# Patient Record
Sex: Female | Born: 1970
Health system: Southern US, Community
[De-identification: ages and names within clinical notes are randomized; demographics above are authoritative.]

## PROBLEM LIST (undated history)

## (undated) DIAGNOSIS — J302 Other seasonal allergic rhinitis: Secondary | ICD-10-CM

## (undated) DIAGNOSIS — D369 Benign neoplasm, unspecified site: Secondary | ICD-10-CM

## (undated) DIAGNOSIS — D649 Anemia, unspecified: Secondary | ICD-10-CM

## (undated) DIAGNOSIS — R202 Paresthesia of skin: Secondary | ICD-10-CM

## (undated) DIAGNOSIS — I10 Essential (primary) hypertension: Secondary | ICD-10-CM

## (undated) DIAGNOSIS — D490 Neoplasm of unspecified behavior of digestive system: Secondary | ICD-10-CM

## (undated) DIAGNOSIS — K8020 Calculus of gallbladder without cholecystitis without obstruction: Secondary | ICD-10-CM

## (undated) HISTORY — DX: Other seasonal allergic rhinitis: J30.2

## (undated) HISTORY — PX: DILATION AND CURETTAGE OF UTERUS: SHX78

## (undated) HISTORY — PX: TUMOR REMOVAL: SHX12

## (undated) HISTORY — DX: Calculus of gallbladder without cholecystitis without obstruction: K80.20

## (undated) HISTORY — DX: Neoplasm of unspecified behavior of digestive system: D49.0

## (undated) HISTORY — DX: Paresthesia of skin: R20.2

## (undated) HISTORY — DX: Essential (primary) hypertension: I10

## (undated) HISTORY — DX: Anemia, unspecified: D64.9

## (undated) HISTORY — DX: Benign neoplasm, unspecified site: D36.9

---

## 2011-03-08 ENCOUNTER — Ambulatory Visit
Admission: RE | Admit: 2011-03-08 | Discharge: 2011-03-08 | Disposition: A | Discharge status: Home / Self Care | Payer: BC Managed Care – PPO | Source: Ambulatory Visit | Attending: Family Medicine | Admitting: Family Medicine

## 2011-03-08 ENCOUNTER — Other Ambulatory Visit: Payer: Self-pay | Admitting: Family Medicine

## 2011-03-08 DIAGNOSIS — R1032 Left lower quadrant pain: Secondary | ICD-10-CM

## 2011-03-08 MED ORDER — IOHEXOL 300 MG/ML  SOLN
125.0000 mL | Freq: Once | INTRAMUSCULAR | Status: AC | PRN
  Administered 2011-03-08: 125 mL via INTRAVENOUS

## 2013-09-06 ENCOUNTER — Other Ambulatory Visit: Payer: Self-pay | Admitting: Family Medicine

## 2013-09-06 DIAGNOSIS — R1011 Right upper quadrant pain: Secondary | ICD-10-CM

## 2013-09-06 DIAGNOSIS — R1030 Lower abdominal pain, unspecified: Secondary | ICD-10-CM

## 2013-09-15 ENCOUNTER — Other Ambulatory Visit: Payer: BC Managed Care – PPO

## 2013-09-17 ENCOUNTER — Ambulatory Visit
Admission: RE | Admit: 2013-09-17 | Discharge: 2013-09-17 | Disposition: A | Discharge status: Home / Self Care | Payer: BC Managed Care – PPO | Source: Ambulatory Visit | Attending: Family Medicine | Admitting: Family Medicine

## 2013-09-17 DIAGNOSIS — R1011 Right upper quadrant pain: Secondary | ICD-10-CM

## 2013-09-17 DIAGNOSIS — R1030 Lower abdominal pain, unspecified: Secondary | ICD-10-CM

## 2013-10-07 ENCOUNTER — Ambulatory Visit (INDEPENDENT_AMBULATORY_CARE_PROVIDER_SITE_OTHER): Payer: BC Managed Care – PPO | Admitting: General Surgery

## 2013-10-25 ENCOUNTER — Encounter (INDEPENDENT_AMBULATORY_CARE_PROVIDER_SITE_OTHER): Payer: Self-pay | Admitting: General Surgery

## 2013-10-25 ENCOUNTER — Ambulatory Visit (INDEPENDENT_AMBULATORY_CARE_PROVIDER_SITE_OTHER): Payer: BC Managed Care – PPO | Admitting: General Surgery

## 2013-10-25 ENCOUNTER — Other Ambulatory Visit (INDEPENDENT_AMBULATORY_CARE_PROVIDER_SITE_OTHER): Payer: Self-pay | Admitting: General Surgery

## 2013-10-25 DIAGNOSIS — R1011 Right upper quadrant pain: Secondary | ICD-10-CM | POA: Insufficient documentation

## 2013-10-25 DIAGNOSIS — K8020 Calculus of gallbladder without cholecystitis without obstruction: Secondary | ICD-10-CM

## 2013-10-25 NOTE — Patient Instructions (Signed)
Please call our office if you would like to schedule the surgery.  Continue lowfat diet.   [facility] ______[facility] LAPAROSCOPIC SURGERY: POST OP INSTRUCTIONS Always review your discharge instruction sheet given to you by the facility where your surgery was performed. IF YOU HAVE DISABILITY OR FAMILY LEAVE FORMS, YOU MUST BRING THEM TO THE OFFICE FOR PROCESSING.   DO NOT GIVE THEM TO YOUR DOCTOR.  1. A prescription for pain medication may be given to you upon discharge.  Take your pain medication as prescribed, if needed.  If narcotic pain medicine is not needed, then you may take acetaminophen (Tylenol) or ibuprofen (Advil) as needed. 2. Take your usually prescribed medications unless otherwise directed. 3. If you need a refill on your pain medication, please contact your pharmacy.  They will contact our office to request authorization. Prescriptions will not be filled after 5pm or on week-ends. 4. You should follow a light diet the first few days after arrival home, such as soup and crackers, etc.  Be sure to include lots of fluids daily. 5. Most patients will experience some swelling and bruising in the area of the incisions.  Ice packs will help.  Swelling and bruising can take several days to resolve.  6. It is common to experience some constipation if taking pain medication after surgery.  Increasing fluid intake and taking a stool softener (such as Colace) will usually help or prevent this problem from occurring.  A mild laxative (Milk of Magnesia or Miralax) should be taken according to package instructions if there are no bowel movements after 48 hours. 7. Unless discharge instructions indicate otherwise, you may remove your bandages 24-48 hours after surgery, and you may shower at that time.  You may have steri-strips (small skin tapes) in place directly over the incision.  These strips should be left on the skin for 7-10 days.  If your surgeon used skin glue on the incision,  you may shower in 24 hours.  The glue will flake off over the next 2-3 weeks.  Any sutures or staples will be removed at the office during your follow-up visit. 8. ACTIVITIES:  You may resume regular (light) daily activities beginning the next day-such as daily self-care, walking, climbing stairs-gradually increasing activities as tolerated.  You may have sexual intercourse when it is comfortable.  Refrain from any heavy lifting or straining until approved by your doctor. a. You may drive when you are no longer taking prescription pain medication, you can comfortably wear a seatbelt, and you can safely maneuver your car and apply brakes. b. RETURN TO WORK:  __________________________________________________________ 9. You should see your doctor in the office for a follow-up appointment approximately 2-3 weeks after your surgery.  Make sure that you call for this appointment within a day or two after you arrive home to insure a convenient appointment time. 10. OTHER INSTRUCTIONS: __________________________________________________________________________________________________________________________ __________________________________________________________________________________________________________________________ WHEN TO CALL YOUR DOCTOR: 1. Fever over 101.0 2. Inability to urinate 3. Continued bleeding from incision. 4. Increased pain, redness, or drainage from the incision. 5. Increasing abdominal pain  The clinic staff is available to answer your questions during regular business hours.  Please don't hesitate to call and ask to speak to one of the nurses for clinical concerns.  If you have a medical emergency, go to the nearest emergency room or call 911.  A surgeon from [facility] is always on call at the hospital. [address] [place], [place], [place]  [address] ?  [id number], [place], [place]   [address] [phone] ? [phone] ? FAX [phone] Web site:  [email or web address]

## 2013-10-25 NOTE — Progress Notes (Signed)
Patient ID: Meghan Haynes, female   DOB: 07/19/1970, 43 y.o.   MRN: [id number]  Chief Complaint  Patient presents with  . Cholelithiasis    HPI Meghan Haynes is a 43 y.o. female.   HPI  She is referred by Dr. Kenton Kingfisher because of intermittent right upper quadrant pain and gallstones. 7 months ago, she had a severe episode of some right upper quadrant pain as a pressure sensation. It lasted a few hours and resolved. No nausea or fevers. She's had some milder episodes since then. She also has some subscapular discomfort. An ultrasound was performed which demonstrated to moderate-sized gallstones. No biliary dilatation. She has been on a low-fat diet and hasn't had any significant episodes since then. No immediate family history of gallbladder disease. No history of jaundice or hepatitis.  She is here with her husband.  Past Medical History  Diagnosis Date  . Hypertension   . Anemia     Past Surgical History  Procedure Laterality Date  . Dilation and curettage of uterus      Family History  Problem Relation Age of Onset  . Cancer Mother     breast  . Heart disease Father   . Cancer Paternal Grandmother     brain     Social History History  Substance Use Topics  . Smoking status: Never Smoker   . Smokeless tobacco: Not on file  . Alcohol Use: No    Not on File  Current Outpatient Prescriptions  Medication Sig Dispense Refill  . acetaminophen (TYLENOL) 325 MG tablet Take 650 mg by mouth every 6 (six) hours as needed.      . nebivolol (BYSTOLIC) 5 MG tablet Take 5 mg by mouth daily.      . Probiotic Product (ALIGN PO) Take by mouth as needed.       No current facility-administered medications for this visit.    Review of Systems Review of Systems  Constitutional:       She has lost weight since being on a diet.  HENT: Negative.   Respiratory: Negative.   Cardiovascular: Negative.   Gastrointestinal: Positive for abdominal pain and constipation.  Genitourinary:  Negative.   Musculoskeletal: Positive for back pain (right subscapular region).  Neurological: Negative.   Hematological: Negative.     There were no vitals taken for this visit.  Physical Exam Physical Exam  Constitutional:  Obese female in no acute distress.  HENT:  Head: Normocephalic.  Eyes: EOM are normal. No scleral icterus.  Cardiovascular: Normal rate and regular rhythm.   Pulmonary/Chest: Effort normal and breath sounds normal.  Abdominal: Soft. She exhibits no distension and no mass. There is no tenderness.  Musculoskeletal: She exhibits no edema and no tenderness.  Neurological: She is alert.  Skin: Skin is warm and dry.  Psychiatric: She has a normal mood and affect. Her behavior is normal.    Data Reviewed Notes from Dr. Kenton Kingfisher. Ultrasound report.  Assessment    Right upper quadrant pain secondary to symptomatic cholelithiasis.     Plan    We discussed laparoscopic cholecystectomy versus dietary management. If she had the operation, she would like to wait until wintertime. I explained to her that if she was having more episodes, I would recommend to be done sooner rather than later. She should stay on a strict low-fat diet.  I have explained the procedure, risks, and aftercare of cholecystectomy.  Risks include but are not limited to bleeding, infection, wound problems, anesthesia, diarrhea, bile leak, injury  to common bile duct/liver/intestine.  She seems to understand.  She will call us back if she would like to schedule the surgery.         Meghan Haynes J 10/25/2013, 11:13 AM

## 2013-11-19 ENCOUNTER — Telehealth (INDEPENDENT_AMBULATORY_CARE_PROVIDER_SITE_OTHER): Payer: Self-pay

## 2013-11-19 NOTE — Telephone Encounter (Signed)
Pt called to ask about stopping suppliments. Pt advised to d/c.

## 2014-01-10 ENCOUNTER — Ambulatory Visit: Payer: Self-pay | Admitting: Otolaryngology

## 2014-05-25 ENCOUNTER — Encounter: Payer: Self-pay | Admitting: *Deleted

## 2014-05-26 ENCOUNTER — Ambulatory Visit (INDEPENDENT_AMBULATORY_CARE_PROVIDER_SITE_OTHER): Payer: BLUE CROSS/BLUE SHIELD | Admitting: Neurology

## 2014-05-26 ENCOUNTER — Encounter: Payer: Self-pay | Admitting: Neurology

## 2014-05-26 VITALS — BP 168/104 | HR 75 | Ht 68.0 in | Wt 216.3 lb

## 2014-05-26 DIAGNOSIS — R252 Cramp and spasm: Secondary | ICD-10-CM

## 2014-05-26 DIAGNOSIS — R292 Abnormal reflex: Secondary | ICD-10-CM

## 2014-05-26 DIAGNOSIS — G959 Disease of spinal cord, unspecified: Secondary | ICD-10-CM

## 2014-05-26 DIAGNOSIS — R258 Other abnormal involuntary movements: Secondary | ICD-10-CM

## 2014-05-26 DIAGNOSIS — R202 Paresthesia of skin: Secondary | ICD-10-CM

## 2014-05-26 DIAGNOSIS — E538 Deficiency of other specified B group vitamins: Secondary | ICD-10-CM

## 2014-05-26 DIAGNOSIS — R29818 Other symptoms and signs involving the nervous system: Secondary | ICD-10-CM

## 2014-05-26 NOTE — Progress Notes (Signed)
[facility] Clinic Note - Initial Visit   Date: 05/26/2014   Shivali Quackenbush MRN: [id number] DOB: 06/09/1970   Dear Dr. Kenton Kingfisher:   Thank you for your kind referral of Yurika Pereda for consultation of right hemisensory deficits. Although her history is well known to you, please allow [place] to reiterate it for the purpose of our medical record. The patient was accompanied to the clinic by husband who also provides collateral information.     History of Present Illness: Alline Pio is a 44 y.o. right-handed Caucasian female with hypertension and right parotid tumor s/p resection (2015) presenting for evaluation of right sided paresthesias.    She went right parotidectomy on 03/08/2014 and has some right residual paresthesias and mild forehead weakness.  She was doing well until about 3-4 weeks later, when she developed numbness and tingling of the right foot.  Over the next few weeks, she started having skin disturbance of the entire right side from her breast down her abdomen, back, lower leg, and thigh.  She initially thought symptoms may be related to gall stones based on the location of discomfort on her abdomen.  She went to her PCP on 1/25 and had routine blood testing which showed low levels of vitamin D and B12, so has been started on supplementation.  She had noticed improvement of paresthesias over the past week, because she feels sensation to her proximal thigh and lower leg is improving.  She continues to have tingling sensation just beneath her right breast and tight sensation of her right foot. She denies any back pain.  Left side is unaffected.  She does endorse having painful leg cramps prior to seeing her PCP.  Out-side paper records, electronic medical record, and images have been reviewed where available and summarized as:  Labs 04/18/2014:  Na 138, K 4.4, glucose 85, Cr 0.42ferritin 11.6, vitamin D 12.0*, vitamin B12 244 (normal-low), TSH 2.23, fT4  0.78, HbA1c 5.6   Past Medical History  Diagnosis Date  . Hypertension   . Anemia   . Seasonal allergies   . Calculus of gallbladder   . Tumor of parotid gland     Past Surgical History  Procedure Laterality Date  . Dilation and curettage of uterus       Medications:  Current Outpatient Prescriptions on File Prior to Visit  Medication Sig Dispense Refill  . acetaminophen (TYLENOL) 325 MG tablet Take 650 mg by mouth every 6 (six) hours as needed.     No current facility-administered medications on file prior to visit.    Allergies: No Known Allergies  Family History: Family History  Problem Relation Age of Onset  . Cancer Mother     breast  . Heart disease Father   . Cancer Paternal Grandmother     brain   . Hypertension Father     Social History: History   Social History  . Marital Status: Married    Spouse Name: N/A  . Number of Children: N/A  . Years of Education: N/A   Occupational History  . Not on file.   Social History Main Topics  . Smoking status: Never Smoker   . Smokeless tobacco: Not on file  . Alcohol Use: No  . Drug Use: No  . Sexual Activity: Not on file   Other Topics Concern  . Not on file   Social History Narrative   Lives with husband and 2 children in a 2 story home.  Works as a Geophysicist/field seismologist.  Education: some college.     Review of Systems:  CONSTITUTIONAL: No fevers, chills, night sweats, or weight loss.   EYES: No visual changes or eye pain ENT: No hearing changes.  No history of nose bleeds.   RESPIRATORY: No cough, wheezing and shortness of breath.   CARDIOVASCULAR: Negative for chest pain, and palpitations.   GI: Negative for abdominal discomfort, blood in stools or black stools.  No recent change in bowel habits.   GU:  No history of incontinence.   MUSCLOSKELETAL: No history of joint pain or swelling.  No myalgias.   SKIN: Negative for lesions, rash, and itching.   HEMATOLOGY/ONCOLOGY: Negative for prolonged  bleeding, bruising easily, and swollen nodes.  No history of cancer.   ENDOCRINE: Negative for cold or heat intolerance, polydipsia or goiter.   PSYCH:  No depression +anxiety symptoms.   NEURO: As Above.   Vital Signs:  BP 168/104 mmHg  Pulse 75  Ht 5\' 8"  (1.727 m)  Wt 216 lb 5 oz (98.119 kg)  BMI 32.90 kg/m2  SpO2 99%  General Medical Exam:   General:  Anxious appearing, comfortable.   Eyes/ENT: see cranial nerve examination.   Neck: No masses appreciated.  Full range of motion without tenderness.  No carotid bruits. Respiratory:  Clear to auscultation, good air entry bilaterally.   Cardiac:  Regular rate and rhythm, no murmur.   Extremities:  No deformities, edema, or skin discoloration.  Skin:  Hives noted over the neck and back.  Neurological Exam: MENTAL STATUS including orientation to time, place, person, recent and remote memory, attention span and concentration, language, and fund of knowledge is normal.  Speech is not dysarthric.  CRANIAL NERVES: II:  No visual field defects.  Unremarkable fundi.   III-IV-VI: Pupils equal round and reactive to light.  Normal conjugate, extra-ocular eye movements in all directions of gaze.  No nystagmus.  No ptosis.   V:  Normal facial sensation, including right V1.  VII:  Normal facial symmetry and movements.  No pathologic facial reflexes.  VIII:  Normal hearing and vestibular function.   IX-X:  Normal palatal movement.   XI:  Normal shoulder shrug and head rotation.   XII:  Normal tongue strength and range of motion, no deviation or fasciculation.  MOTOR:  No atrophy, fasciculations or abnormal movements.  No pronator drift.  Tone is normal.    Right Upper Extremity:    Left Upper Extremity:    Deltoid  5/5   Deltoid  5/5   Biceps  5/5   Biceps  5/5   Triceps  5/5   Triceps  5/5   Wrist extensors  5/5   Wrist extensors  5/5   Wrist flexors  5/5   Wrist flexors  5/5   Finger extensors  5/5   Finger extensors  5/5   Finger  flexors  5/5   Finger flexors  5/5   Dorsal interossei  5/5   Dorsal interossei  5/5   Abductor pollicis  5/5   Abductor pollicis  5/5   Tone (Ashworth scale)  0  Tone (Ashworth scale)  0   Right Lower Extremity:    Left Lower Extremity:    Hip flexors  5/5   Hip flexors  5/5   Hip extensors  5/5   Hip extensors  5/5   Knee flexors  5/5   Knee flexors  5/5   Knee extensors  5/5   Knee extensors  5/5   Dorsiflexors  5/5   Dorsiflexors  5/5   Plantarflexors  5/5   Plantarflexors  5/5   Toe extensors  5/5   Toe extensors  5/5   Toe flexors  5/5   Toe flexors  5/5   Tone (Ashworth scale)  0+  Tone (Ashworth scale)  0+   MSRs:  Right                                                                 Left brachioradialis 3+  brachioradialis 3+  biceps 3+  biceps 3+  triceps 3+  triceps 3+  patellar 3+  patellar 3+  ankle jerk 2+  ankle jerk 2+  Hoffman no  Hoffman no  plantar response up  plantar response down   SENSORY:  Pin prick deferred by patient.Reduced light touch over the T7 dermatome on the right.  Otherwise, normal and symmetric perception of vibration and proprioception.  Romberg's sign absent.   COORDINATION/GAIT: Normal finger-to- nose-finger and heel-to-shin.  Intact rapid alternating movements bilaterally.  Able to rise from a chair without using arms.  Gait narrow based and stable. Tandem and stressed gait intact.    IMPRESSION: Mrs. Westendorf is a 44 year-old female presenting for evaluation of right sided paresthesias and muscle tightness.  Her exam shows generalized hyperreflexia throughout with extensor plantar response on the right along with T7 sensory level.  With these myelopathic findings, I would like to obtain MRI cervical and thoracic spine. Multiple questions regarding possibilites which I answered to the best of my ability, specifically as to what she may have.  Consideration include transverse myelitis (vital, nutritional, inflammatory, etc), demyelinating disease,  spinal stenosis, as well as vitamin deficiency.  Her previous labs disclosed low vitamin B12 and low vitamin D levels for which she is taking oral supplementation.  I do not feel that her symptoms are related to her recent surgery, as I would have expected symptom onset to be closer to surgery date.  Fortunately, symptoms are improving, so I will hold off on any symptomatic medications.  Of note, her blood pressure is elevated and she broke out in hives due to anxiety regarding appointment and reports having white coat syndrome.  PLAN/RECOMMENDATIONS:  1.  MRI cervical and thoracic spine wwo contrast 2.  Additional work-up pending above results   The duration of this appointment visit was 55 minutes of face-to-face time with the patient.  Greater than 50% of this time was spent in counseling, explanation of diagnosis, planning of further management, and coordination of care.   Thank you for allowing me to participate in patient's care.  If I can answer any additional questions, I would be pleased to do so.    Sincerely,    Camryn Quesinberry K. Posey Pronto, DO

## 2014-05-26 NOTE — Patient Instructions (Addendum)
1.  MRI cervical and thoracic spine 2.  Continue taking vitamin B12 and vitamin D supplements  3.  We will call you with the results of the testing and let you know what steps to take next

## 2014-05-26 NOTE — Progress Notes (Signed)
Note faxed.

## 2014-06-15 ENCOUNTER — Ambulatory Visit
Admission: RE | Admit: 2014-06-15 | Discharge: 2014-06-15 | Disposition: A | Discharge status: Home / Self Care | Payer: BLUE CROSS/BLUE SHIELD | Source: Ambulatory Visit | Attending: Neurology | Admitting: Neurology

## 2014-06-15 ENCOUNTER — Telehealth: Payer: Self-pay | Admitting: Neurology

## 2014-06-15 DIAGNOSIS — G959 Disease of spinal cord, unspecified: Secondary | ICD-10-CM

## 2014-06-15 DIAGNOSIS — R252 Cramp and spasm: Secondary | ICD-10-CM

## 2014-06-15 DIAGNOSIS — R202 Paresthesia of skin: Secondary | ICD-10-CM

## 2014-06-15 DIAGNOSIS — R292 Abnormal reflex: Secondary | ICD-10-CM

## 2014-06-15 DIAGNOSIS — E538 Deficiency of other specified B group vitamins: Secondary | ICD-10-CM

## 2014-06-15 MED ORDER — GADOBENATE DIMEGLUMINE 529 MG/ML IV SOLN
20.0000 mL | Freq: Once | INTRAVENOUS | Status: AC | PRN
  Administered 2014-06-15: 20 mL via INTRAVENOUS

## 2014-06-15 NOTE — Telephone Encounter (Signed)
Unable to reach patient notified [facility] imaging they will get a creatinine when she gets there.

## 2014-06-15 NOTE — Telephone Encounter (Signed)
Pt called stating that she completed her imaging done today.

## 2014-06-15 NOTE — Telephone Encounter (Signed)
[facility] called, pt has appt today at 10am. They are looking for labs - BUN and Creatinine. I checked in EPIC and did not see any results off hand. Please check into this, has pt had labs drawn? CB# [phone]. They are trying to avoid another blood draw if she has had these recently, otherwise they can do it when she comes in today. Please call ASAP as appt is scheduled for 10am today / Sherri

## 2014-06-15 NOTE — Telephone Encounter (Signed)
Is encounter ready to be closed?

## 2014-06-16 ENCOUNTER — Telehealth: Payer: Self-pay | Admitting: Neurology

## 2014-06-16 NOTE — Telephone Encounter (Signed)
Called patient to discuss MRI results, but there was no answer.  Message left to return my call.  Meghan Haynes, if Meghan Haynes calls back, please let her know that there is a very small area on her spinal cord that looks abnormal which is causing her abnormal sensation on the right - it may be due to low vitamin B12 levels.  If she is doing better, we can repeat her MRI in a few months.  If no improvement, I would like to order MRI of the brain to be sure we are not missing an autoimmune/inflammatory process.    Meghan Haynes K. Posey Pronto, DO

## 2014-06-16 NOTE — Telephone Encounter (Signed)
Appointment scheduled for June 3 at 8:00.  Appointment card mailed to patient.

## 2014-06-16 NOTE — Telephone Encounter (Signed)
MRI results discussed with patient. She reports doing slightly better with only right rib paresthesias. We discussed potential possibilities including changes due to B 12 deficiency, demyelinating disease or transverse myelitis. She denies any new or worsening of symptoms. I offered to perform MRI brain to look for other abnormalities of the nervous system, however because she is doing relatively well, we will follow her clinically. If she develops any new symptoms, low threshold to image.  I will see her back in 2-3 months.  Murline Weigel K. Posey Pronto, DO

## 2014-08-18 ENCOUNTER — Ambulatory Visit (INDEPENDENT_AMBULATORY_CARE_PROVIDER_SITE_OTHER): Payer: BLUE CROSS/BLUE SHIELD | Admitting: Neurology

## 2014-08-18 ENCOUNTER — Encounter: Payer: Self-pay | Admitting: Neurology

## 2014-08-18 VITALS — BP 177/95 | HR 74 | Ht 68.0 in | Wt 226.6 lb

## 2014-08-18 DIAGNOSIS — R202 Paresthesia of skin: Secondary | ICD-10-CM | POA: Diagnosis not present

## 2014-08-18 HISTORY — DX: Paresthesia of skin: R20.2

## 2014-08-18 NOTE — Patient Instructions (Signed)

## 2014-08-18 NOTE — Progress Notes (Signed)
Reason for visit: Paresthesias  Referring physician: Dr. Danella Meghan Haynes is a 44 y.o. 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 Within 3 weeks following the surgery, the patient began developing paresthesias involving the right foot that gradually spread up the right leg and into the lower rib cage on the right. The patient had a squeezing sensation in the right lower rib cage. The patient had a sensation of stiffness of the right leg, but no definite change in her ability to ambulate. The patient denied any staggering or falling. She denied any issues controlling the bowels or the bladder. She did not have any sensory alteration on the right arm or right face. She denies any visual disturbances, or speech changes. The patient has had some problems with fatigue. She denies any true weakness of the extremities. She indicates that in the summer of 2015, she was having very transient, brief episodes of right arm weakness and tingling in the feet. The patient had some transient visual alterations in her early 53s when she would go from a light to a dark environment. She does not recall which eye was affected. The patient has had blood work that was done, showing a vitamin B12 level of 244 which is in the low normal range for that lab. A vitamin D level also was done that was in the low normal range. The patient has gone on oral supplementation of the vitamin B12, and she has improved with her symptoms over several months. The patient still has some residual paresthesias of the right foot only. The patient underwent MRI study evaluation of the cervical spine and thoracic spine. The studies were reviewed online. The studies question a spinal cord lesion at the thoracic level 3 and 4. No cord lesions were seen in the cervical area. The patient is sent to this office for further evaluation.  The patient was seen previously by Dr. Posey Haynes from [facility].  Past Medical History  Diagnosis Date  . Hypertension   . Anemia   . Seasonal allergies   . Calculus of gallbladder   . Tumor of parotid gland   . Adenoma   . Paresthesia 08/18/2014    Past Surgical History  Procedure Laterality Date  . Dilation and curettage of uterus    . Tumor removal      right parotid    Family History  Problem Relation Age of Onset  . Cancer Mother     breast  . Heart disease Father   . Cancer Paternal Grandmother     brain   . Hypertension Father   . Heart disease Paternal Grandfather   . Healthy Son   . Healthy Daughter     Social history:  reports that she has never smoked. She does not have any smokeless tobacco history on file. She reports that she does not drink alcohol or use illicit drugs.  Medications:  Prior to Admission medications   Medication Sig Start Date End Date Taking? Authorizing Provider  acetaminophen (TYLENOL) 325 MG tablet Take 650 mg by mouth every 6 (six) hours as needed.   Yes Historical Provider, MD  Cholecalciferol (VITAMIN D3) 1000 UNITS CAPS Take by mouth.   Yes Historical Provider, MD  Cyanocobalamin 1000 MCG TBCR Take by mouth.   Yes Historical Provider, MD  docusate sodium (COLACE) 50 MG capsule Take by mouth daily as  needed.    Yes Historical Provider, MD  Polysaccharide Iron Complex (FERREX 150 PO) Take 1 tablet by mouth daily.   Yes Historical Provider, MD     No Known Allergies  ROS:  Out of a complete 14 system review of symptoms, the patient complains only of the following symptoms, and all other reviewed systems are negative.  Weight loss Constipation Iron deficiency anemia Feeling cold Allergies Numbness, paresthesias Insomnia  Blood pressure 177/95, pulse 74, height 5\' 8"  (1.727 m), weight 226 lb 9.6 oz (102.785 kg).  Physical Exam  General: The patient is alert and cooperative at the time of the examination. The patient is  moderately obese.  Eyes: Pupils are equal, round, and reactive to light. Discs are flat bilaterally.  Neck: The neck is supple, no carotid bruits are noted.  Respiratory: The respiratory examination is clear.  Cardiovascular: The cardiovascular examination reveals a regular rate and rhythm, no obvious murmurs or rubs are noted.  Skin: Extremities are without significant edema.  Neurologic Exam  Mental status: The patient is alert and oriented x 3 at the time of the examination. The patient has apparent normal recent and remote memory, with an apparently normal attention span and concentration ability.  Cranial nerves: Facial symmetry is present. There is good sensation of the face to pinprick and soft touch bilaterally. The strength of the facial muscles and the muscles to head turning and shoulder shrug are normal bilaterally. Speech is well enunciated, no aphasia or dysarthria is noted. Extraocular movements are full. Visual fields are full. The tongue is midline, and the patient has symmetric elevation of the soft palate. No obvious hearing deficits are noted.  Motor: The motor testing reveals 5 over 5 strength of all 4 extremities. Good symmetric motor tone is noted throughout.  Sensory: Sensory testing is intact to pinprick, soft touch, vibration sensation, and position sense on all 4 extremities. No evidence of extinction is noted.  Coordination: Cerebellar testing reveals good finger-nose-finger and heel-to-shin bilaterally.  Gait and station: Gait is normal. Tandem gait is normal. Romberg is negative. No drift is seen.  Reflexes: Deep tendon reflexes are symmetric and normal bilaterally. Toes are downgoing bilaterally.   MRI cervical and thoracic 06/15/14:  IMPRESSION: Small central disc protrusions at C4-5 and C5-6.  Cervical spinal cord appears normal however image quality degraded by mild motion in the cervical spine.  Abnormal cord signal posteriorly on the right at  T3 and T4. The cord is not enlarged and does not show abnormal enhancement. Given the history of the low B12 levels, subacute combined degeneration is in the differential. Unilateral lesion seems unusual for subacute combined degeneration. Transverse myelitis and multiple sclerosis also possible etiologies.  * MRI scan images were reviewed online. I agree with the written report.    Assessment/Plan:  1. Transient paresthesias, right leg and side   The patient has ill-defined abnormalities in the upper thoracic spinal cord on MRI evaluation. The patient may have had a transient episode of sensory alteration in the summer of 2015. The patient never was documented to have a vitamin B-12 deficiency, the level was in the low normal range, and a methylmalonic acid level was never checked to determine if the patient was functioning deficient. The symptoms were unilateral, not bilateral as one would expect with a true B12 deficiency. I do not believe that her current symptoms are related to a vitamin B12 issue. The patient needs a full evaluation to exclude demyelinating disease as an etiology for  her symptoms. The patient will be set up for MRI evaluation of the brain, and further blood work will be done today. The patient will have a visual evoked response test. Lumbar puncture may be considered in the future. The patient will follow-up otherwise in 3 months.  Jill Alexanders MD 08/18/2014 8:00 PM  [facility] [address] [place], [place] [address]  Phone [phone] Fax [phone]

## 2014-08-23 ENCOUNTER — Telehealth: Payer: Self-pay | Admitting: Neurology

## 2014-08-23 ENCOUNTER — Telehealth: Payer: Self-pay

## 2014-08-23 LAB — LUPUS ANTICOAGULANT
Dilute Viper Venom Time: 39.9 s (ref 0.0–55.1)
PTT LA: 41.3 s (ref 0.0–50.0)
Thrombin Time: 15.4 s (ref 0.0–20.0)
dPT Confirm Ratio: 1.06 Ratio (ref 0.00–1.40)
dPT: 40.1 s (ref 0.0–55.0)

## 2014-08-23 LAB — ANGIOTENSIN CONVERTING ENZYME: Angio Convert Enzyme: 50 U/L (ref 14–82)

## 2014-08-23 LAB — NEUROMYELITIS OPTICA AUTOAB, IGG

## 2014-08-23 LAB — HIV ANTIBODY (ROUTINE TESTING W REFLEX): HIV Screen 4th Generation wRfx: NONREACTIVE

## 2014-08-23 LAB — SEDIMENTATION RATE: Sed Rate: 28 mm/hr (ref 0–32)

## 2014-08-23 LAB — RPR: RPR: NONREACTIVE

## 2014-08-23 LAB — B. BURGDORFI ANTIBODIES: Lyme IgG/IgM Ab: <0.91 {ISR} (ref 0.00–0.90)

## 2014-08-23 LAB — ANA W/REFLEX: Anti Nuclear Antibody(ANA): NEGATIVE

## 2014-08-23 LAB — FACTOR 5 LEIDEN

## 2014-08-23 LAB — RHEUMATOID FACTOR: Rheumatoid fact SerPl-aCnc: 9.2 IU/mL (ref 0.0–13.9)

## 2014-08-23 NOTE — Telephone Encounter (Signed)
The blood work results are normal, patient has been asked to call office back

## 2014-08-23 NOTE — Telephone Encounter (Signed)
Patient called regarding MRI scheduled for 08/24/14 at [facility]. She stated the last MRI she had the kidney function on labs were borderline low and was inquiring what the results were that was taken on Thursday.  She also wants to know if [facility] [place] is the most open MRI available. She said she did not do well with the last MRI. Please call and advise. Patient can be reached [phone].

## 2014-08-23 NOTE — Telephone Encounter (Signed)
I called the patient. The patient had blood work done in mid March, estimated GFR was 13, she should be good to have the MRI study done with and without contrast. I discussed the blood work results with her, everything was normal.

## 2014-08-24 ENCOUNTER — Telehealth: Payer: Self-pay

## 2014-08-24 DIAGNOSIS — R202 Paresthesia of skin: Secondary | ICD-10-CM | POA: Diagnosis not present

## 2014-08-24 NOTE — Telephone Encounter (Signed)
Molly from [place] called to find out exactly what pills we gave to the patient for her MRI. I explained to her that we give 3 Xanax 0.5 mg. She asked if the patient would be okay to drive by 1:38. I explained to her that we always tell patients to have a driver when they take the Xanax and that I did not feel comfortable saying she would be okay to drive by 8:71. She stated that she would let the patient know that we could not say she was okay to drive by that time.

## 2014-08-26 ENCOUNTER — Other Ambulatory Visit: Payer: Self-pay | Admitting: Diagnostic Neuroimaging

## 2014-08-26 ENCOUNTER — Telehealth: Payer: Self-pay | Admitting: Neurology

## 2014-08-26 ENCOUNTER — Ambulatory Visit: Payer: BLUE CROSS/BLUE SHIELD | Admitting: Neurology

## 2014-08-26 ENCOUNTER — Ambulatory Visit (INDEPENDENT_AMBULATORY_CARE_PROVIDER_SITE_OTHER): Payer: BLUE CROSS/BLUE SHIELD | Admitting: Neurology

## 2014-08-26 DIAGNOSIS — R202 Paresthesia of skin: Secondary | ICD-10-CM | POA: Diagnosis not present

## 2014-08-26 NOTE — Telephone Encounter (Signed)
I called the patient. The visual response test was unremarkable. MRI the brain as been done, I do not have the results of that study yet.

## 2014-08-26 NOTE — Telephone Encounter (Signed)
I called patient. MRI scan of the brain was unremarkable making the diagnosis of MS less likely. I would consider lumbar puncture for spinal fluid analysis. If she is amenable to this, she is to contact our office.   MRI brain 08/26/14:  IMPRESSION:  Normal MRI brain (with and without).

## 2014-08-26 NOTE — Procedures (Signed)
    History:   Meghan Haynes is a 44 year old patient with a history of right-sided sensory alterations, evidence of a upper thoracic level cord lesion. The patient being evaluated for possible demyelinating disease.   Description: The visual evoked response test was performed today using 32 x 32 check sizes. The absolute latencies for the N1 and the P100 wave forms were within normal limits bilaterally. The amplitudes for the P100 wave forms were also within normal limits bilaterally. The visual acuity was 20/20 OD and 20/30 OS corrected.  Impression:  The visual evoked response test above was within normal limits bilaterally. No evidence of conduction slowing was seen within the anterior visual pathways on either side on today's evaluation.

## 2014-09-01 NOTE — Telephone Encounter (Signed)
I called the patient. She wants to know where the spot found on her spine was exactly and what should be done to follow up with this. I explained that Dr. Jannifer Franklin' note stated she had a normal MRI. She asked to speak to Dr. Jannifer Franklin to determine if the LP is actually necessary.

## 2014-09-01 NOTE — Telephone Encounter (Signed)
Patient called inquiring about MRI results. She has some additional questions. Please call and advise. Patient can be reached at [phone].

## 2014-09-01 NOTE — Telephone Encounter (Signed)
I called the patient. The brain MRI was unremarkable, the patient has a thoracic level 3/4 lesion on the spinal cord. This may be a transverse myelitis, not multiple sclerosis. I have recommended considering a lumbar puncture, the patient does not wish to do this at this time. I will wait about 9 months, repeat MRI evaluation of the brain and spinal cord.

## 2014-09-05 ENCOUNTER — Other Ambulatory Visit: Payer: Self-pay | Admitting: Family Medicine

## 2014-09-05 DIAGNOSIS — N281 Cyst of kidney, acquired: Secondary | ICD-10-CM

## 2014-09-22 ENCOUNTER — Ambulatory Visit
Admission: RE | Admit: 2014-09-22 | Discharge: 2014-09-22 | Disposition: A | Discharge status: Home / Self Care | Payer: BLUE CROSS/BLUE SHIELD | Source: Ambulatory Visit | Attending: Family Medicine | Admitting: Family Medicine

## 2014-09-22 DIAGNOSIS — N281 Cyst of kidney, acquired: Secondary | ICD-10-CM

## 2015-05-31 ENCOUNTER — Telehealth: Payer: Self-pay | Admitting: Neurology

## 2015-05-31 NOTE — Telephone Encounter (Signed)
I called the patient. The patient indicates that she is without any symptoms at this point, she has completely resolved percent her symptoms over 6 months. The patient may have had a transverse myelitis. I have recommended rechecking MRI of the brain and spinal cord to exclude demyelinating disease. The patient is not sure that she want to pursue this at this time, she will get back to me regarding this. I would at least consider MRI of the brain as lesions may be asymptomatic in this area.

## 2015-08-31 ENCOUNTER — Other Ambulatory Visit: Payer: Self-pay | Admitting: Family Medicine

## 2015-08-31 DIAGNOSIS — N281 Cyst of kidney, acquired: Secondary | ICD-10-CM

## 2015-08-31 DIAGNOSIS — E7800 Pure hypercholesterolemia, unspecified: Secondary | ICD-10-CM | POA: Diagnosis not present

## 2015-08-31 DIAGNOSIS — D649 Anemia, unspecified: Secondary | ICD-10-CM | POA: Diagnosis not present

## 2015-08-31 DIAGNOSIS — E538 Deficiency of other specified B group vitamins: Secondary | ICD-10-CM | POA: Diagnosis not present

## 2015-08-31 DIAGNOSIS — E559 Vitamin D deficiency, unspecified: Secondary | ICD-10-CM | POA: Diagnosis not present

## 2015-08-31 DIAGNOSIS — I10 Essential (primary) hypertension: Secondary | ICD-10-CM | POA: Diagnosis not present

## 2015-09-11 ENCOUNTER — Other Ambulatory Visit: Payer: BLUE CROSS/BLUE SHIELD

## 2015-09-29 ENCOUNTER — Ambulatory Visit
Admission: RE | Admit: 2015-09-29 | Discharge: 2015-09-29 | Disposition: A | Discharge status: Home / Self Care | Payer: BLUE CROSS/BLUE SHIELD | Source: Ambulatory Visit | Attending: Family Medicine | Admitting: Family Medicine

## 2015-09-29 DIAGNOSIS — N281 Cyst of kidney, acquired: Secondary | ICD-10-CM

## 2015-10-06 ENCOUNTER — Other Ambulatory Visit: Payer: Self-pay | Admitting: Family Medicine

## 2015-10-06 DIAGNOSIS — N281 Cyst of kidney, acquired: Secondary | ICD-10-CM

## 2015-10-17 DIAGNOSIS — K8020 Calculus of gallbladder without cholecystitis without obstruction: Secondary | ICD-10-CM | POA: Diagnosis not present

## 2015-10-17 DIAGNOSIS — N281 Cyst of kidney, acquired: Secondary | ICD-10-CM | POA: Diagnosis not present

## 2015-10-24 DIAGNOSIS — R1030 Lower abdominal pain, unspecified: Secondary | ICD-10-CM | POA: Diagnosis not present

## 2015-10-24 DIAGNOSIS — K8020 Calculus of gallbladder without cholecystitis without obstruction: Secondary | ICD-10-CM | POA: Diagnosis not present

## 2015-11-22 DIAGNOSIS — Z01411 Encounter for gynecological examination (general) (routine) with abnormal findings: Secondary | ICD-10-CM | POA: Diagnosis not present

## 2015-11-22 DIAGNOSIS — N920 Excessive and frequent menstruation with regular cycle: Secondary | ICD-10-CM | POA: Diagnosis not present

## 2015-11-22 DIAGNOSIS — Z6835 Body mass index (BMI) 35.0-35.9, adult: Secondary | ICD-10-CM | POA: Diagnosis not present

## 2015-11-22 DIAGNOSIS — Z1231 Encounter for screening mammogram for malignant neoplasm of breast: Secondary | ICD-10-CM | POA: Diagnosis not present

## 2015-11-22 DIAGNOSIS — Z01419 Encounter for gynecological examination (general) (routine) without abnormal findings: Secondary | ICD-10-CM | POA: Diagnosis not present

## 2015-11-28 ENCOUNTER — Ambulatory Visit: Payer: Self-pay | Admitting: General Surgery

## 2015-11-28 DIAGNOSIS — K8020 Calculus of gallbladder without cholecystitis without obstruction: Secondary | ICD-10-CM | POA: Diagnosis not present

## 2015-11-28 NOTE — H&P (Signed)
Meghan Haynes 11/28/2015 9:22 AM Location: [facility] Patient #: [id number] DOB: Dec 06, 1970 Married / Language: English / Race: White Female  History of Present Illness Meghan Hollingshead MD; 11/28/2015 9:56 AM) The patient is a 45 year old female.   Note:She presents today to discuss rescheduling cholecystectomy. She was seen approximately 2 years ago with symptomatic cholelithiasis. She ended up having a right parotid gland tumor that was removed and she decided just to try dietary modification rather than have a cholecystectomy. Recently, her episodes of right upper quadrant discomfort radiating to her right subscapular area started to increase. She also has intermittent constipation because she is taking iron. Dr. Kenton Kingfisher has sent her back over here for [place] to rediscuss cholecystectomy. Her husband is with her. She's been trying to low-fat foods. The 2 most recent episodes woke her up at night. Last ultrasound was July 7 of this year to follow up on some kidney cysts. Incidental multiple gallstones were noted.  Other Problems (April Staton, [place]; 11/28/2015 9:22 AM) Cholelithiasis Hemorrhoids High blood pressure  Past Surgical History (April Staton, [place]; 11/28/2015 9:22 AM) Oral Surgery  Diagnostic Studies History (April Staton, [place]; 11/28/2015 9:22 AM) Colonoscopy never Mammogram within last year Pap Smear 1-5 years ago  Allergies (April Staton, CMA; 11/28/2015 9:23 AM) No Known Drug Allergies 11/28/2015  Medication History (April Staton, CMA; 11/28/2015 9:24 AM) Losartan Potassium (50MG  Tablet, Oral) Active. Integra (62.5-62.5-40-3MG  Capsule, Oral) Active. ALPRAZolam (0.25MG  Tablet, Oral) Active. Stool Softener (Oral as needed) Specific dose unknown - Active. Vitamin B12 (100MCG Tablet, Oral) Active. Vitamin D (Cholecalciferol) (1000UNIT Capsule, Oral) Active. Medications Reconciled  Social History (April Staton, CMA; 11/28/2015 9:22 AM) Caffeine  use Tea. No alcohol use No drug use Tobacco use Never smoker.  Family History (April Staton, [place]; 11/28/2015 9:22 AM) Breast Cancer Mother. Hypertension Father.  Pregnancy / Birth History (April Staton, [place]; 11/28/2015 9:22 AM) Age at menarche 89 years. Gravida 2 Length (months) of breastfeeding 3-6 Maternal age 3-25 Para 2     Review of Systems (April Staton CMA; 11/28/2015 9:22 AM) General Not Present- Appetite Loss, Chills, Fatigue, Fever, Night Sweats, Weight Gain and Weight Loss. Skin Not Present- Change in Wart/Mole, Dryness, Hives, Jaundice, New Lesions, Non-Healing Wounds, Rash and Ulcer. HEENT Present- Seasonal Allergies and Wears glasses/contact lenses. Not Present- Earache, Hearing Loss, Hoarseness, Nose Bleed, Oral Ulcers, Ringing in the Ears, Sinus Pain, Sore Throat, Visual Disturbances and Yellow Eyes. Respiratory Not Present- Bloody sputum, Chronic Cough, Difficulty Breathing, Snoring and Wheezing. Breast Not Present- Breast Mass, Breast Pain, Nipple Discharge and Skin Changes. Cardiovascular Not Present- Chest Pain, Difficulty Breathing Lying Down, Leg Cramps, Palpitations, Rapid Heart Rate, Shortness of Breath and Swelling of Extremities. Gastrointestinal Present- Abdominal Pain, Bloating, Constipation, Excessive gas and Hemorrhoids. Not Present- Bloody Stool, Change in Bowel Habits, Chronic diarrhea, Difficulty Swallowing, Gets full quickly at meals, Indigestion, Nausea, Rectal Pain and Vomiting. Female Genitourinary Not Present- Frequency, Nocturia, Painful Urination, Pelvic Pain and Urgency. Musculoskeletal Not Present- Back Pain, Joint Pain, Joint Stiffness, Muscle Pain, Muscle Weakness and Swelling of Extremities. Neurological Not Present- Decreased Memory, Fainting, Headaches, Numbness, Seizures, Tingling, Tremor, Trouble walking and Weakness. Psychiatric Not Present- Anxiety, Bipolar, Change in Sleep Pattern, Depression, Fearful and Frequent  crying. Hematology Not Present- Blood Thinners, Easy Bruising, Excessive bleeding, Gland problems, HIV and Persistent Infections.  Vitals (April Staton CMA; 11/28/2015 9:24 AM) 11/28/2015 9:24 AM Weight: 228.38 lb Height: 67in Height was reported by patient. Body Surface Area: 2.14 m Body Mass Index:  35.77 kg/m  Temp.: 98.67F(Oral)  Pulse: 74 (Regular)  P.OX: 98% (Room air) BP: 132/84 (Sitting, Left Arm, Standard)      Physical Exam Meghan Hollingshead MD; 11/28/2015 9:57 AM)  The physical exam findings are as follows: Note:General: Obese female in NAD. Pleasant and cooperative.  HEENT: [place]/AT, no external nasal or ear masses, mucous membranes are moist  EYES: no scleral icterus  CV: RRR, no murmur, no edema  CHEST: Breath sounds equal and clear. Respirations nonlabored.  ABDOMEN: Soft, nontender, nondistended, no masses, no organomegaly.  MUSCULOSKELETAL: no edema, no venous stasis changes  SKIN: No jaundice.  NEUROLOGIC: Alert and oriented, answers questions appropriately, normal gait and station.  PSYCHIATRIC: She is slightly anxious.    Assessment & Plan Meghan Hollingshead MD; 11/28/2015 10:00 AM)  SYMPTOMATIC CHOLELITHIASIS (K80.20) Impression: Frequency have her episodes have been increasing. Also has some constipation that is exacerbated by her taking her iron for anemia. Current workup for this is ongoing. Right upper quadrant symptoms are fairly typical of biliary colic.  Plan: I recommended laparoscopic cholecystectomy. I have explained the procedure, risks, success rate and aftercare of cholecystectomy. Risks include but are not limited to bleeding, infection, wound problems, anesthesia, diarrhea, bile leak, injury to common bile duct/liver/intestine. She seems to understand and agrees to proceed.  Jackolyn Confer, MD

## 2016-02-28 DIAGNOSIS — K8020 Calculus of gallbladder without cholecystitis without obstruction: Secondary | ICD-10-CM | POA: Diagnosis not present

## 2016-03-01 DIAGNOSIS — K8010 Calculus of gallbladder with chronic cholecystitis without obstruction: Secondary | ICD-10-CM | POA: Diagnosis not present

## 2016-03-01 DIAGNOSIS — K8020 Calculus of gallbladder without cholecystitis without obstruction: Secondary | ICD-10-CM | POA: Diagnosis not present

## 2016-03-12 DIAGNOSIS — J358 Other chronic diseases of tonsils and adenoids: Secondary | ICD-10-CM | POA: Diagnosis not present

## 2016-03-20 DIAGNOSIS — I10 Essential (primary) hypertension: Secondary | ICD-10-CM | POA: Diagnosis not present

## 2016-06-02 IMAGING — US US ABDOMEN COMPLETE
1 series · 13 of 25 positions shown · non-contrast
Comparison: CT scan 03/08/2011

CLINICAL DATA: Right upper quadrant pain and lower abdominal pain

EXAM:
ULTRASOUND ABDOMEN COMPLETE

[Series 1: us abdomen complete · 0.28mm/px · 13 of 82 slices shown]
[im 1/82]
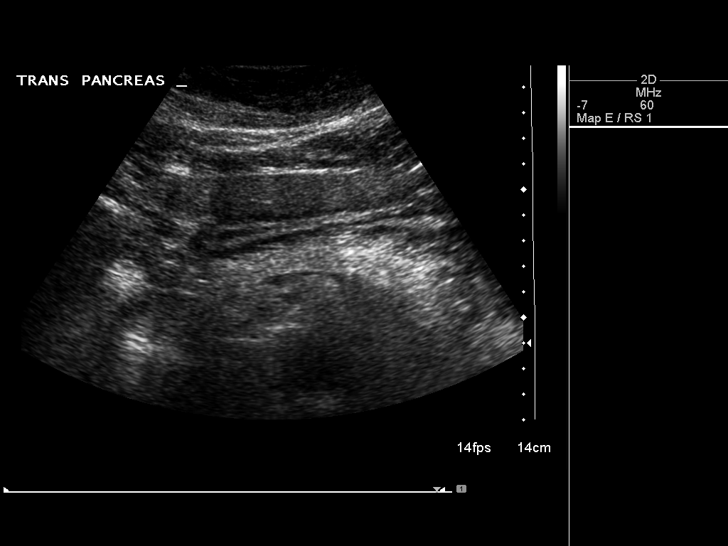
[im 7/82]
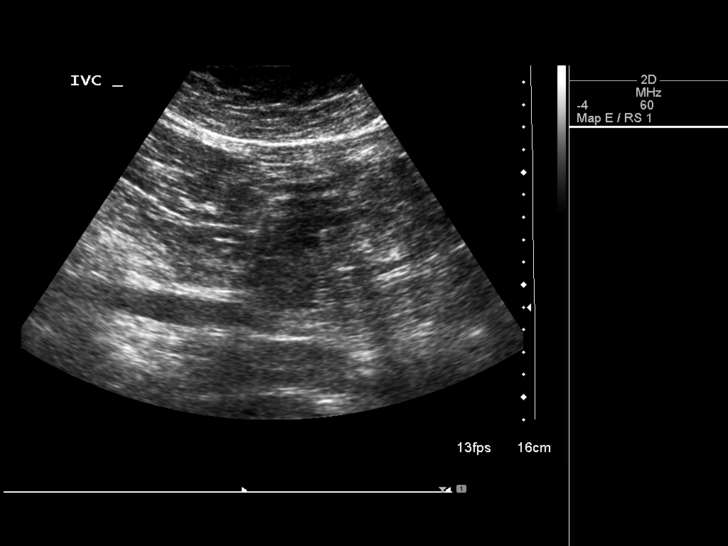
[im 14/82]
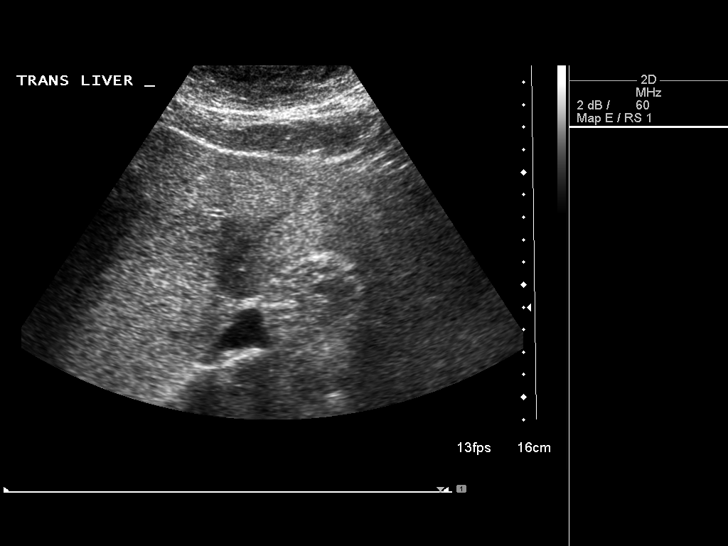
[im 21/82]
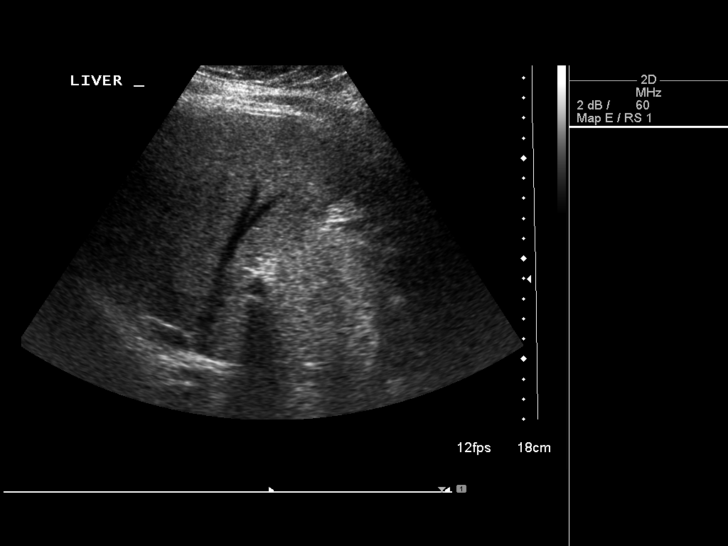
[im 28/82]
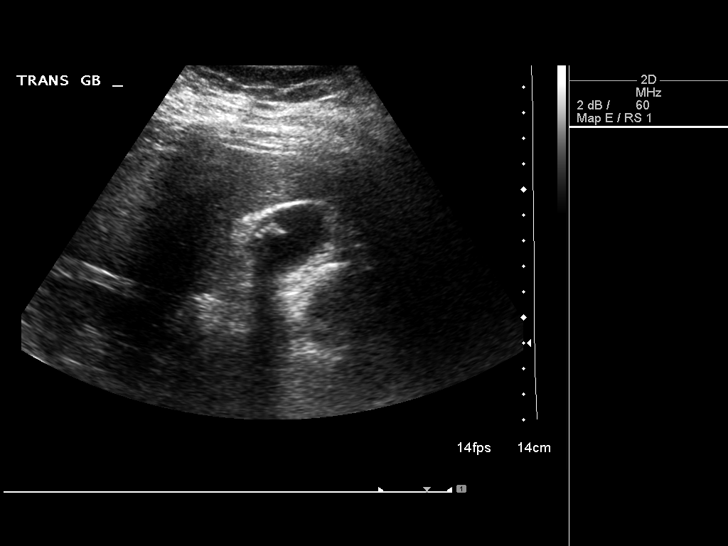
[im 34/82]
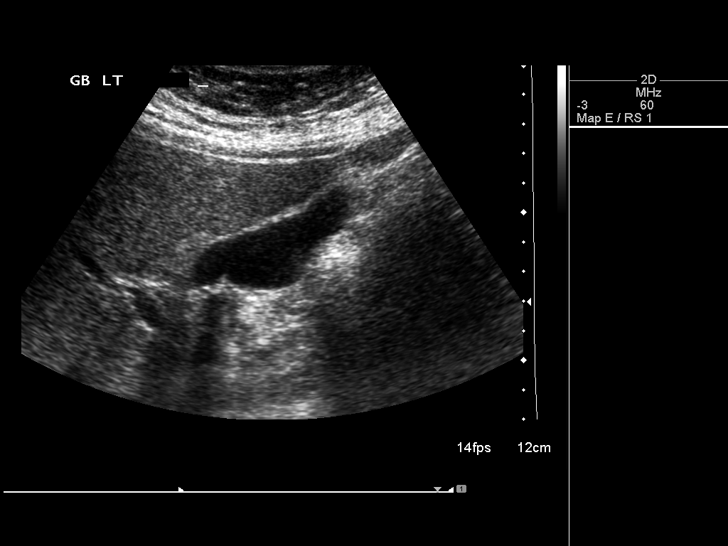
[im 41/82]
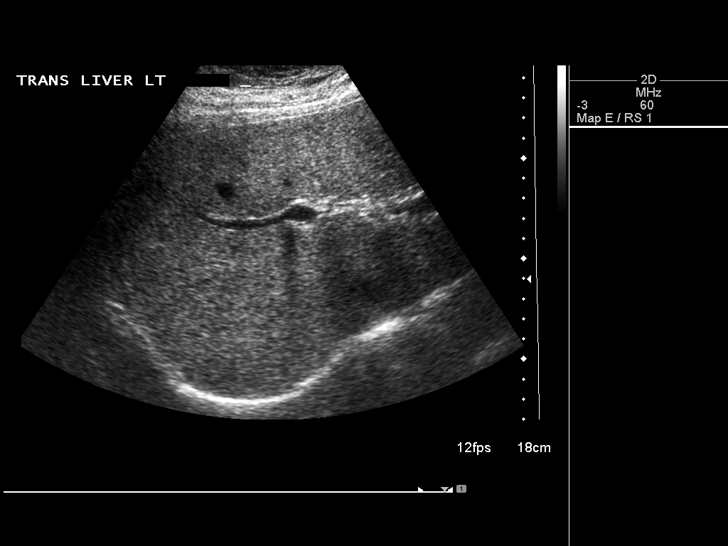
[im 48/82]
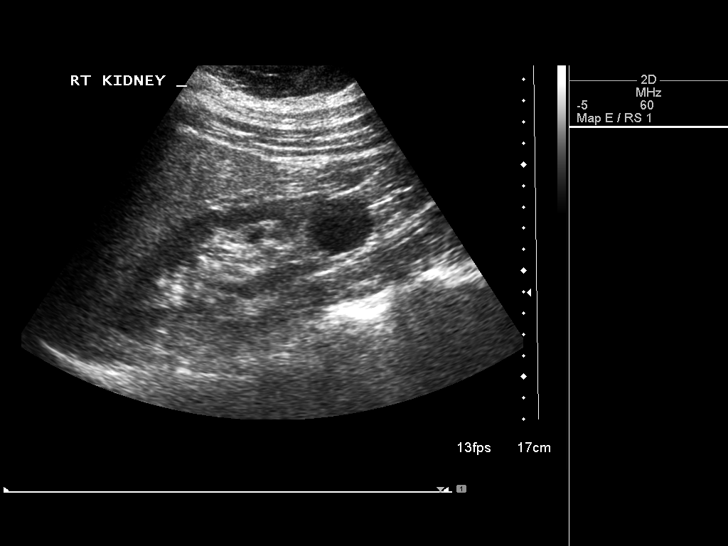
[im 55/82]
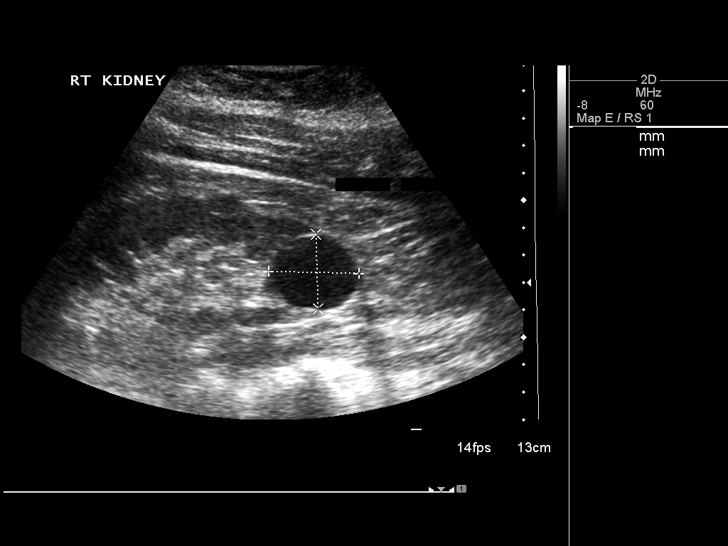
[im 61/82]
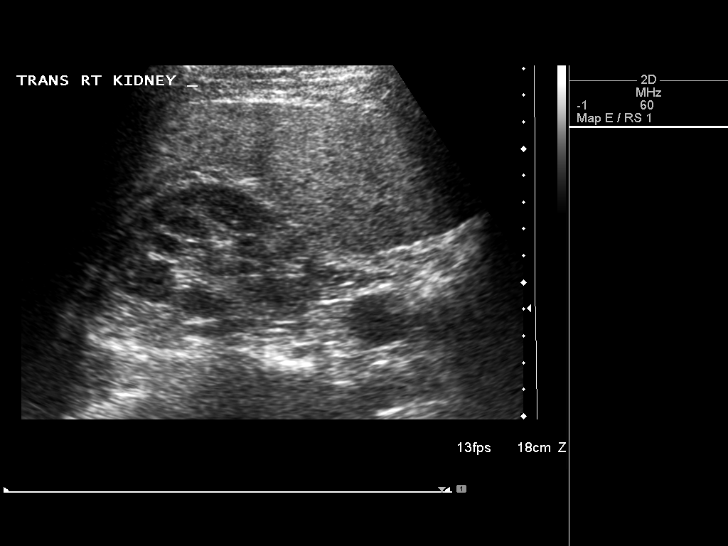
[im 68/82]
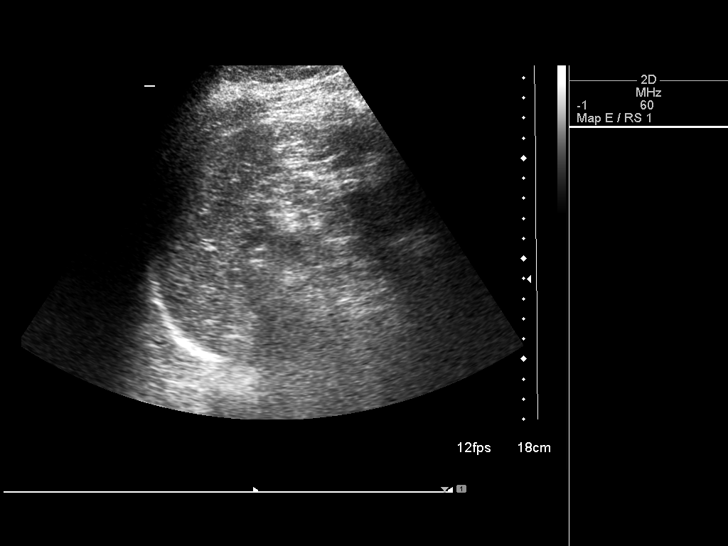
[im 75/82]
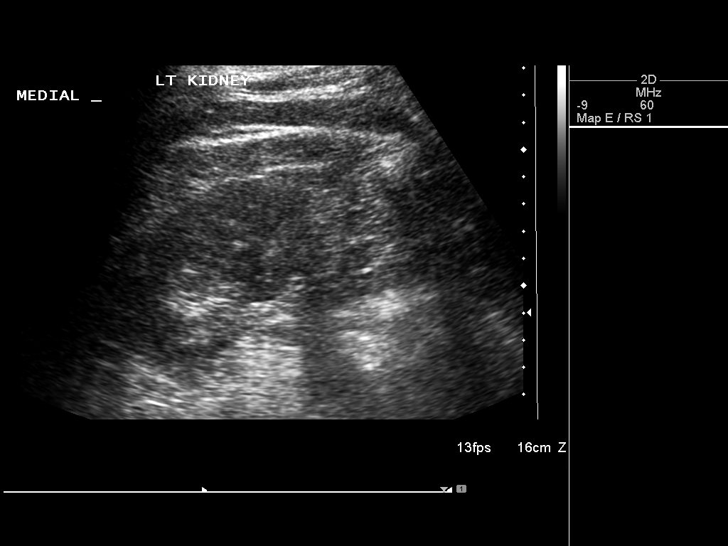
[im 82/82]
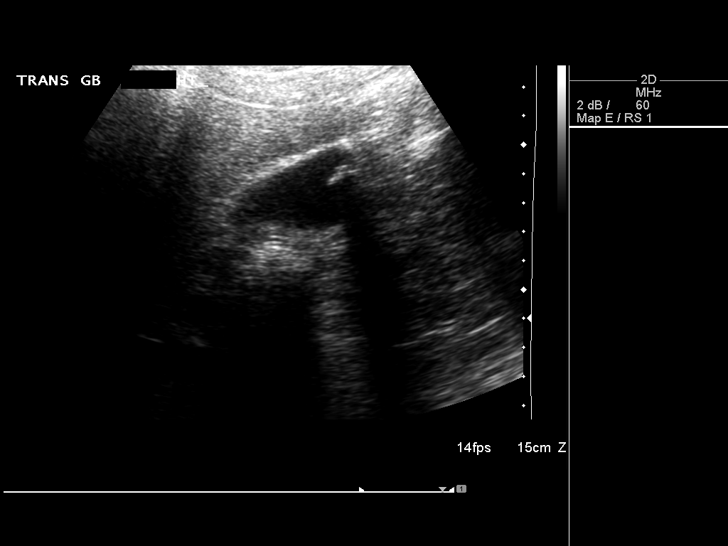

[13 of 25 positions shown; findings below may reference images not displayed]

FINDINGS: Gallbladder:

There are 2 mobile shadowing echogenic calculi measuring about
cm each within the gallbladder. No Murphy's sign. Wall thickness is
normal.

Common bile duct:

Diameter: 4 mm

Liver:

No focal lesion identified. Within normal limits in parenchymal
echogenicity.

IVC:

No abnormality visualized.

Pancreas:

Visualized portion unremarkable.

Spleen:

Size and appearance within normal limits.

Right Kidney:

Length: 13.0 cm. Laterally in the mid to lower pole, there is a 19 x
20 x 20 mm cyst demonstrating 2 thin internal septations. This cyst
was seen on prior CT scan, and appears unchanged in size. There is
also a simple cyst in the lower pole measuring 3.3 x 2.7 x 3.3 cm,
larger when compared to prior study.

Left Kidney:

Length: 11.8 cm. Echogenicity within normal limits. No mass or
hydronephrosis visualized.

Abdominal aorta:

No aneurysm visualized.

Other findings:

None.
IMPRESSION: 1. Cholelithiasis
2. Complicated cyst right kidney (in addition to lower pole simple
cyst). All low there are 2 internal septations, this lesion was seen
to represent a cyst based on Hounsfield attenuation value on CT scan
performed 03/08/2011 and it does not appear any larger. It is
therefore considered to be almost certainly benign. A follow-up
ultrasound in 6-12 months could be considered to re-evaluate.

## 2016-07-02 DIAGNOSIS — D509 Iron deficiency anemia, unspecified: Secondary | ICD-10-CM | POA: Diagnosis not present

## 2016-09-20 DIAGNOSIS — I10 Essential (primary) hypertension: Secondary | ICD-10-CM | POA: Diagnosis not present

## 2016-09-20 DIAGNOSIS — D649 Anemia, unspecified: Secondary | ICD-10-CM | POA: Diagnosis not present

## 2017-02-21 DIAGNOSIS — Z1239 Encounter for other screening for malignant neoplasm of breast: Secondary | ICD-10-CM | POA: Diagnosis not present

## 2017-02-21 DIAGNOSIS — Z1231 Encounter for screening mammogram for malignant neoplasm of breast: Secondary | ICD-10-CM | POA: Diagnosis not present

## 2017-03-26 DIAGNOSIS — Z130 Encounter for screening for diseases of the blood and blood-forming organs and certain disorders involving the immune mechanism: Secondary | ICD-10-CM | POA: Diagnosis not present

## 2017-03-26 DIAGNOSIS — Z6839 Body mass index (BMI) 39.0-39.9, adult: Secondary | ICD-10-CM | POA: Diagnosis not present

## 2017-03-26 DIAGNOSIS — Z1321 Encounter for screening for nutritional disorder: Secondary | ICD-10-CM | POA: Diagnosis not present

## 2017-03-26 DIAGNOSIS — Z01419 Encounter for gynecological examination (general) (routine) without abnormal findings: Secondary | ICD-10-CM | POA: Diagnosis not present

## 2017-04-08 DIAGNOSIS — E559 Vitamin D deficiency, unspecified: Secondary | ICD-10-CM | POA: Diagnosis not present

## 2017-04-08 DIAGNOSIS — E7800 Pure hypercholesterolemia, unspecified: Secondary | ICD-10-CM | POA: Diagnosis not present

## 2017-04-08 DIAGNOSIS — I10 Essential (primary) hypertension: Secondary | ICD-10-CM | POA: Diagnosis not present

## 2017-04-08 DIAGNOSIS — E538 Deficiency of other specified B group vitamins: Secondary | ICD-10-CM | POA: Diagnosis not present

## 2017-04-14 DIAGNOSIS — N912 Amenorrhea, unspecified: Secondary | ICD-10-CM | POA: Diagnosis not present

## 2017-05-08 DIAGNOSIS — E7800 Pure hypercholesterolemia, unspecified: Secondary | ICD-10-CM | POA: Diagnosis not present

## 2017-05-26 DIAGNOSIS — N926 Irregular menstruation, unspecified: Secondary | ICD-10-CM | POA: Diagnosis not present

## 2017-06-07 IMAGING — US US RENAL
1 series · 14 of 25 positions shown · non-contrast
Comparison: 09/17/2013

CLINICAL DATA: Followup renal cysts

EXAM:
RENAL / URINARY TRACT ULTRASOUND COMPLETE

[Series 1: us renal · 0.30mm/px · 14 of 62 slices shown]
[im 1/62]
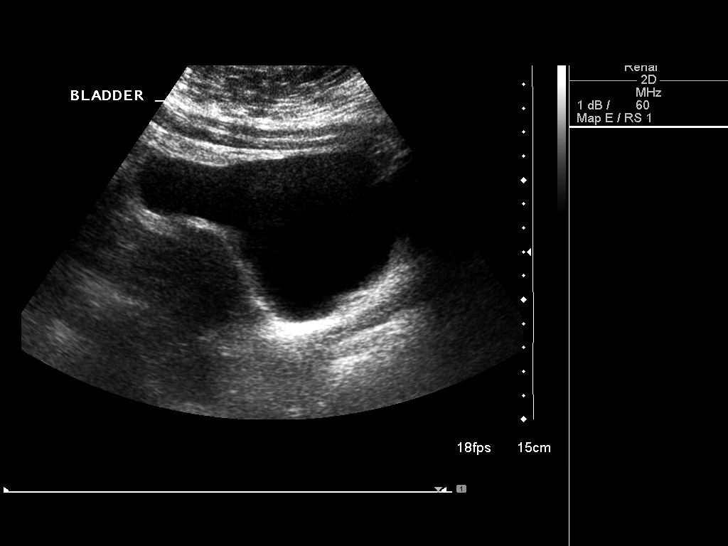
[im 6/62]
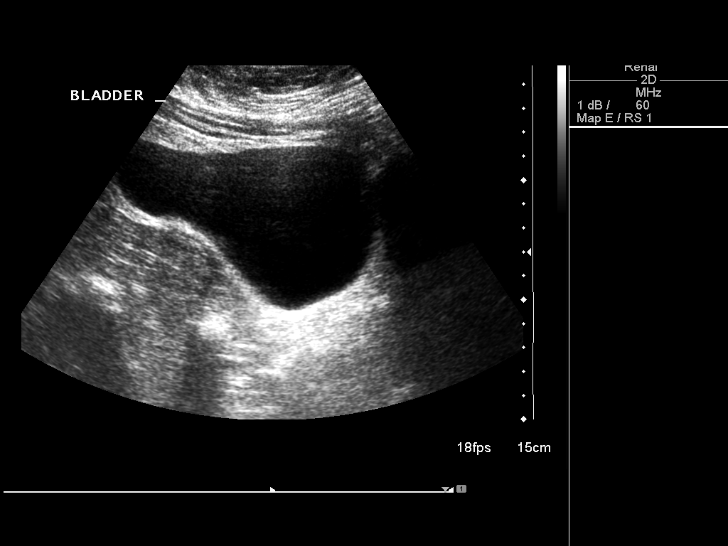
[im 11/62]
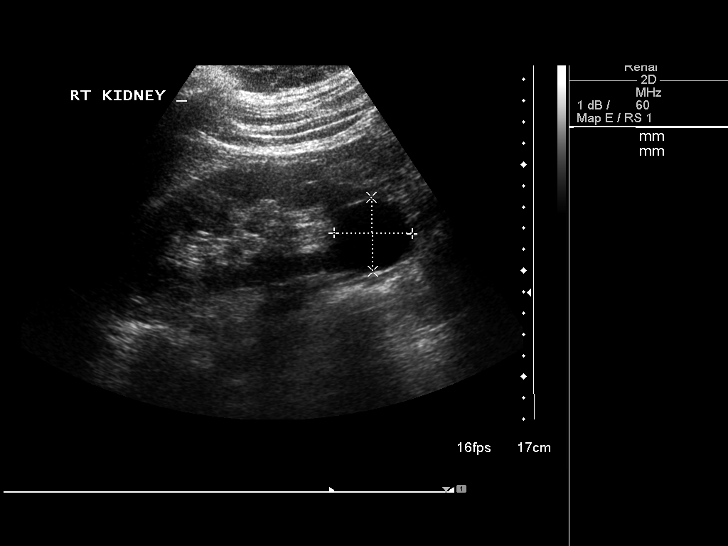
[im 16/62]
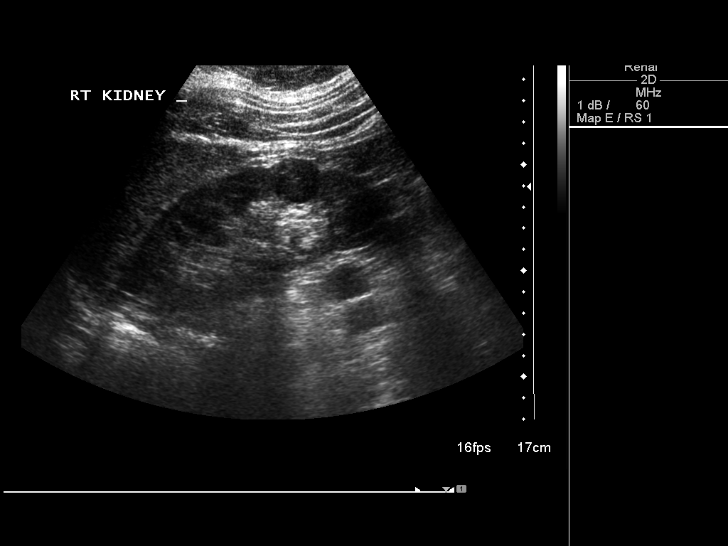
[im 21/62]
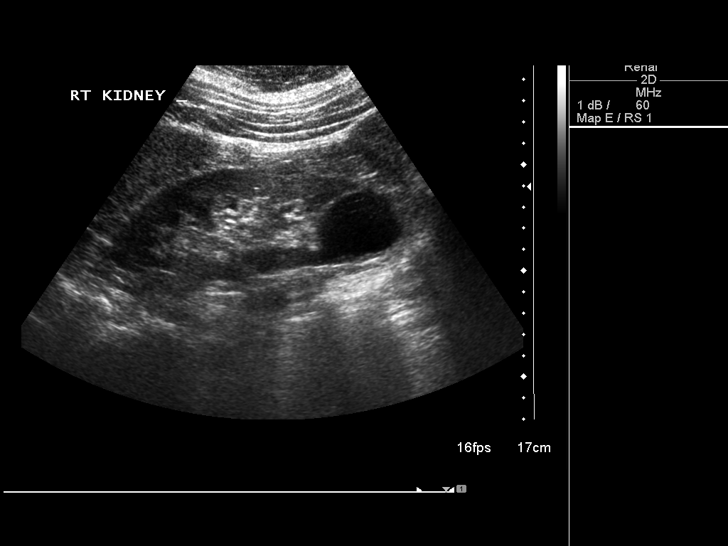
[im 23/62]
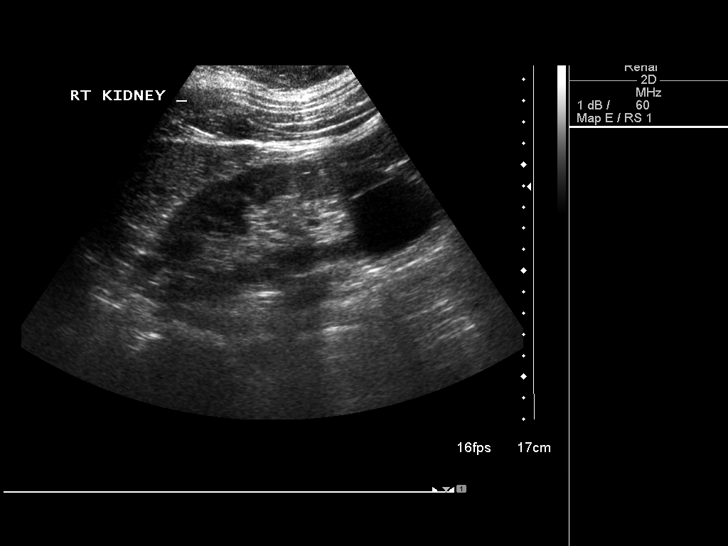
[im 28/62]
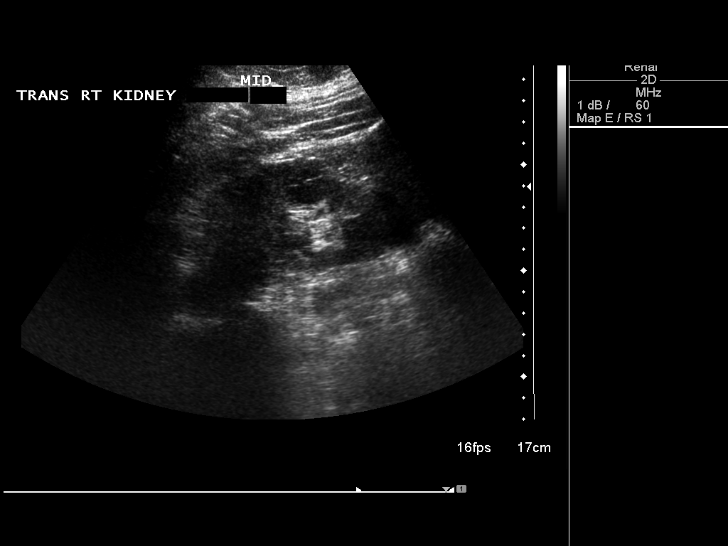
[im 34/62]
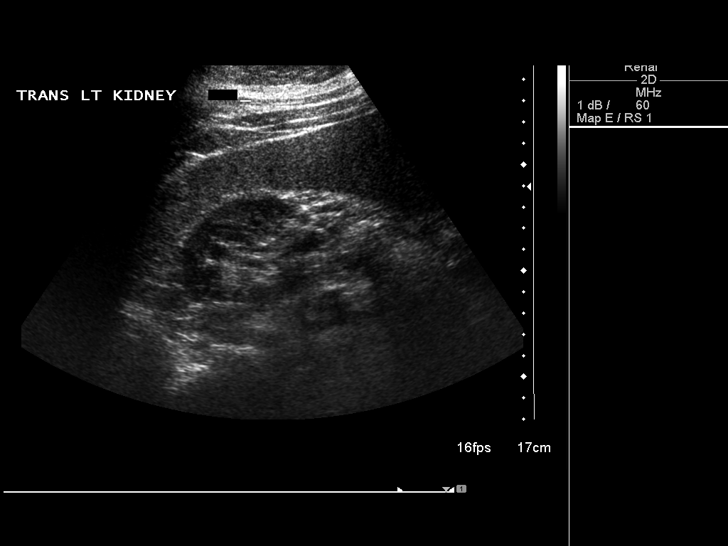
[im 39/62]
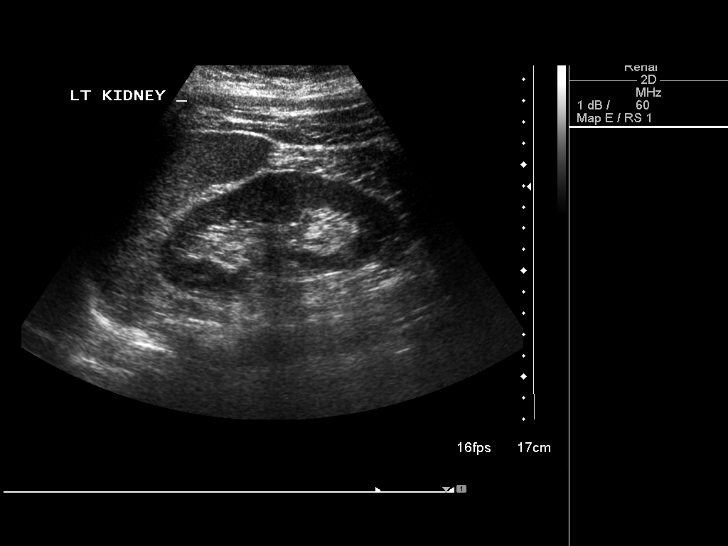
[im 41/62]
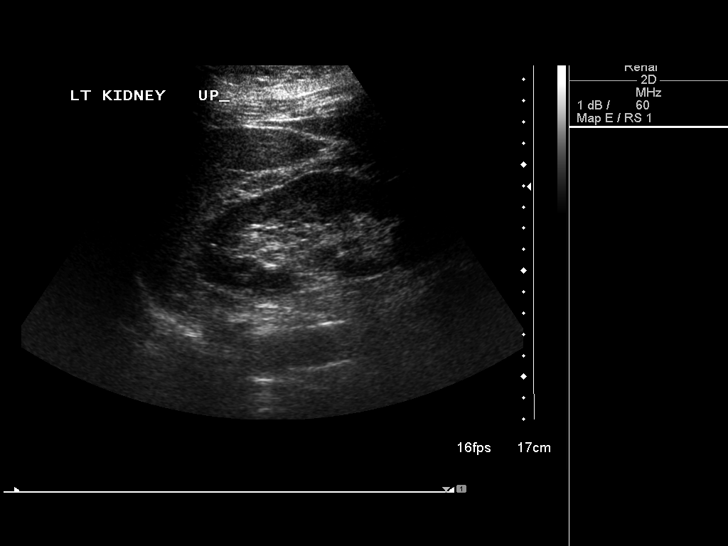
[im 46/62]
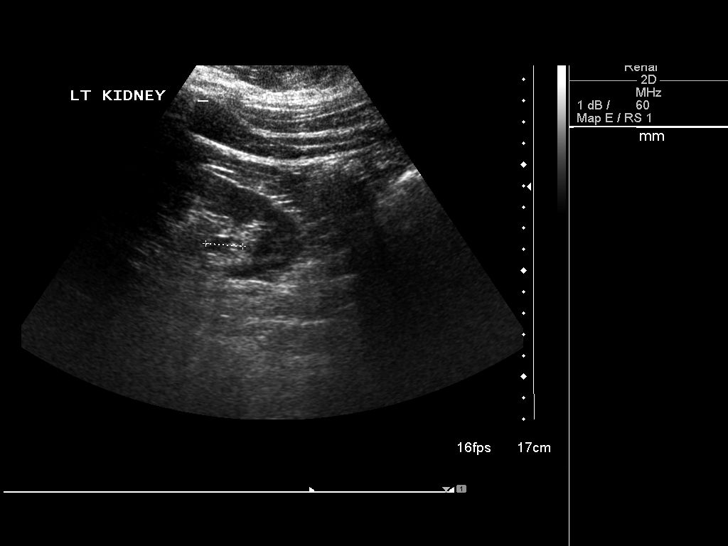
[im 51/62]
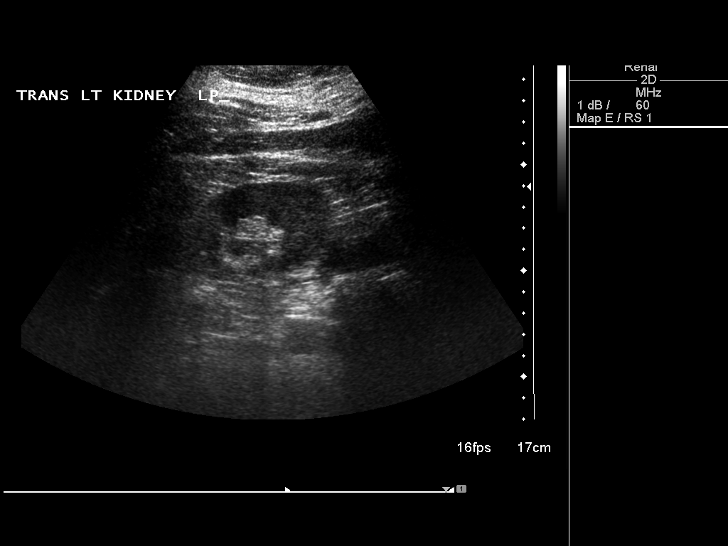
[im 56/62]
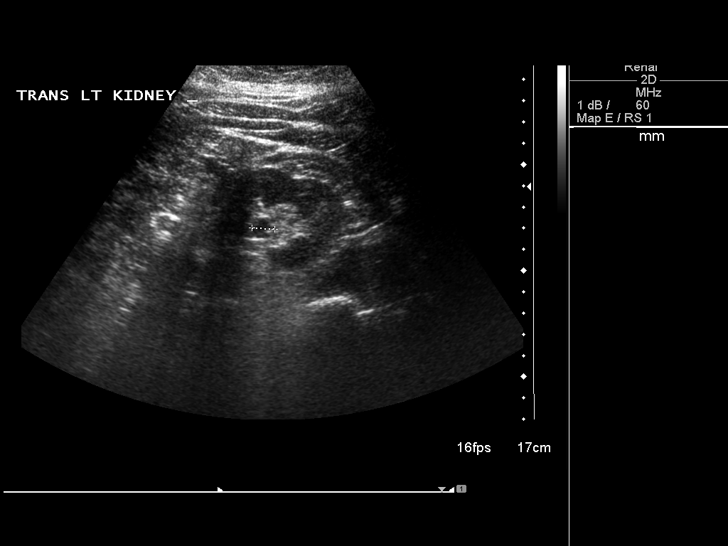
[im 62/62]
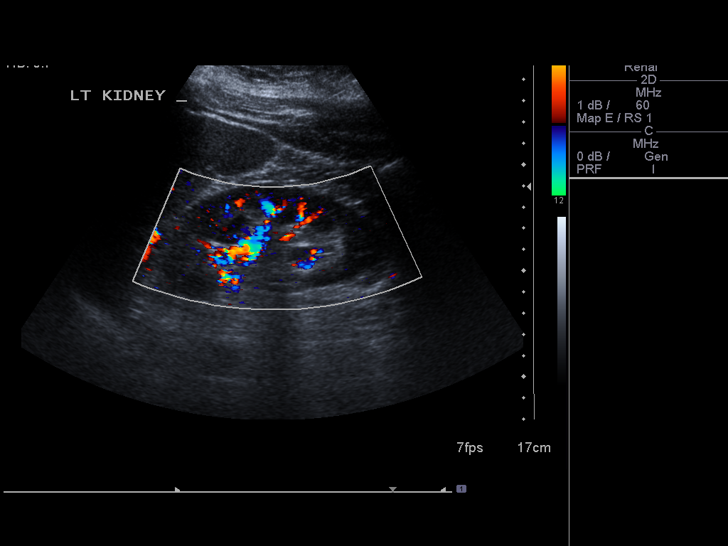

[14 of 25 positions shown; findings below may reference images not displayed]

FINDINGS: Right Kidney:

Length: 14.1 cm. Normal echogenicity with no hydronephrosis. There
is a 3.7 cm simple cyst in the lower pole. There is a 2.3 cm
hypoechoic mass in the mid pole with a few thin internal echogenic
septations.

Left Kidney:

Length: 12 cm. Normal echogenicity with no hydronephrosis. There is
a 1.8 cm cyst in the lower pole.

Bladder:

Appears normal for degree of bladder distention.
IMPRESSION: Tiny nodule lower pole left kidney too small to characterize but
possibly a cyst.

Right kidney shows a cyst in the lower pole. Hypoechoic 2.3 cm mass
in the midpole previously measured 2.0 cm. Slight differences in
size within standard error of measurement. The lesion appears
otherwise unchanged with 1 year of stability suggesting a benign
lesion likely a complex cyst. Consider follow-up ultrasound in 6-12
months.

## 2017-06-11 DIAGNOSIS — N921 Excessive and frequent menstruation with irregular cycle: Secondary | ICD-10-CM | POA: Diagnosis not present

## 2017-08-28 DIAGNOSIS — N926 Irregular menstruation, unspecified: Secondary | ICD-10-CM | POA: Diagnosis not present

## 2017-08-28 DIAGNOSIS — N921 Excessive and frequent menstruation with irregular cycle: Secondary | ICD-10-CM | POA: Diagnosis not present

## 2017-08-28 DIAGNOSIS — N924 Excessive bleeding in the premenopausal period: Secondary | ICD-10-CM | POA: Diagnosis not present

## 2017-10-08 DIAGNOSIS — E559 Vitamin D deficiency, unspecified: Secondary | ICD-10-CM | POA: Diagnosis not present

## 2017-10-08 DIAGNOSIS — E538 Deficiency of other specified B group vitamins: Secondary | ICD-10-CM | POA: Diagnosis not present

## 2017-10-08 DIAGNOSIS — E7800 Pure hypercholesterolemia, unspecified: Secondary | ICD-10-CM | POA: Diagnosis not present

## 2017-10-08 DIAGNOSIS — I10 Essential (primary) hypertension: Secondary | ICD-10-CM | POA: Diagnosis not present

## 2017-10-14 DIAGNOSIS — I10 Essential (primary) hypertension: Secondary | ICD-10-CM | POA: Diagnosis not present

## 2017-10-14 DIAGNOSIS — E538 Deficiency of other specified B group vitamins: Secondary | ICD-10-CM | POA: Diagnosis not present

## 2017-10-14 DIAGNOSIS — E559 Vitamin D deficiency, unspecified: Secondary | ICD-10-CM | POA: Diagnosis not present

## 2017-12-18 ENCOUNTER — Ambulatory Visit (INDEPENDENT_AMBULATORY_CARE_PROVIDER_SITE_OTHER): Payer: BLUE CROSS/BLUE SHIELD | Admitting: Family Medicine

## 2017-12-18 ENCOUNTER — Encounter (INDEPENDENT_AMBULATORY_CARE_PROVIDER_SITE_OTHER): Payer: Self-pay | Admitting: Family Medicine

## 2017-12-18 DIAGNOSIS — M79671 Pain in right foot: Secondary | ICD-10-CM

## 2017-12-18 MED ORDER — DICLOFENAC SODIUM 1 % TD GEL: 4.0000 g | Freq: Four times a day (QID) | TRANSDERMAL | 6 refills | Status: AC | PRN

## 2017-12-18 NOTE — Progress Notes (Signed)
Office Visit Note   Patient: Meghan Haynes           Date of Birth: 29-Jun-1970           MRN: [id number] Visit Date: 12/18/2017 Requested by: Shirline Frees, MD [place], [place] [address] PCP: Shirline Frees, MD  Subjective: Chief Complaint  Patient presents with  . Right Foot - Pain    Right Heel Pain    HPI: She is here with right heel pain.  Symptoms started a few months ago.  She thinks it began when wearing flat shoes over the summer.  Now she has plantar pain when she first starts walking, at the end of the day it throbs for a while.  Denies any numbness.  She has tried stretching, ice, plantar fascia night splint, arch supports in her shoes.  She feels like she is getting a little bit better but in the past week it has flared up and she is having more severe pain.  No previous problems with her heel.  She does have a history of vitamin D deficiency and she is not currently taking anything for it.  She works as a Geophysicist/field seismologist, self-employed.               ROS: Previous history of parotid gland tumor treated surgically.  She has a history of vitamin B12 deficiency.   Objective: Vital Signs: There were no vitals taken for this visit.  Physical Exam:  Right heel: She has tight hamstrings and heel cords.  Negative Tinel's tarsal tunnel.  Mild pain with flexion of the small toes against resistance but the pain is in the calf, not the foot.  No tenderness over the Achilles or lateral calcaneus.  Maximum tenderness at the medial origin of the plantar fascia.  She has normal arches nonweightbearing.  Imaging: None today.  Assessment & Plan: 1.  Right heel pain, suspect plantar fasciitis.  Inflexibility predisposing her to this. -Home stretches for hamstrings and heel cords.  Ice water soaks.  We will treat her vitamin D deficiency more aggressively. -X-rays and possible cortisone injection if symptoms do not improve.  Physical therapy would be another  consideration.   Follow-Up Instructions: No follow-ups on file.       Procedures: None   PMFS History: Patient Active Problem List   Diagnosis Date Noted  . Paresthesia 08/18/2014  . RUQ abdominal pain 10/25/2013  . Cholelithiasis 10/25/2013   Past Medical History:  Diagnosis Date  . Adenoma   . Anemia   . Calculus of gallbladder   . Hypertension   . Paresthesia 08/18/2014  . Seasonal allergies   . Tumor of parotid gland     Family History  Problem Relation Age of Onset  . Cancer Mother        breast  . Heart disease Father   . Cancer Paternal Grandmother        brain   . Hypertension Father   . Heart disease Paternal Grandfather   . Healthy Son   . Healthy Daughter     Past Surgical History:  Procedure Laterality Date  . DILATION AND CURETTAGE OF UTERUS    . TUMOR REMOVAL     right parotid   Social History   Occupational History  . Not on file  Tobacco Use  . Smoking status: Never Smoker  . Smokeless tobacco: Never Used  Substance and Sexual Activity  . Alcohol use: No    Alcohol/week: 0.0  standard drinks  . Drug use: No  . Sexual activity: Not on file

## 2017-12-19 ENCOUNTER — Telehealth (INDEPENDENT_AMBULATORY_CARE_PROVIDER_SITE_OTHER): Payer: Self-pay | Admitting: Family Medicine

## 2017-12-19 NOTE — Telephone Encounter (Signed)
Patient was given rx for voltaren gel, when she went to the pharmacy they told her it needed prior authorization. Please advise # [phone]

## 2017-12-22 NOTE — Telephone Encounter (Signed)
Sent for PA

## 2018-03-30 DIAGNOSIS — Z1231 Encounter for screening mammogram for malignant neoplasm of breast: Secondary | ICD-10-CM | POA: Diagnosis not present

## 2018-03-30 DIAGNOSIS — Z6839 Body mass index (BMI) 39.0-39.9, adult: Secondary | ICD-10-CM | POA: Diagnosis not present

## 2018-03-30 DIAGNOSIS — Z01419 Encounter for gynecological examination (general) (routine) without abnormal findings: Secondary | ICD-10-CM | POA: Diagnosis not present

## 2018-04-06 ENCOUNTER — Other Ambulatory Visit: Payer: Self-pay | Admitting: Obstetrics and Gynecology

## 2018-04-06 DIAGNOSIS — Z803 Family history of malignant neoplasm of breast: Secondary | ICD-10-CM

## 2018-05-21 DIAGNOSIS — D508 Other iron deficiency anemias: Secondary | ICD-10-CM | POA: Diagnosis not present

## 2018-05-21 DIAGNOSIS — E7800 Pure hypercholesterolemia, unspecified: Secondary | ICD-10-CM | POA: Diagnosis not present

## 2018-05-21 DIAGNOSIS — I10 Essential (primary) hypertension: Secondary | ICD-10-CM | POA: Diagnosis not present

## 2018-05-26 DIAGNOSIS — N921 Excessive and frequent menstruation with irregular cycle: Secondary | ICD-10-CM | POA: Diagnosis not present

## 2018-05-29 DIAGNOSIS — N921 Excessive and frequent menstruation with irregular cycle: Secondary | ICD-10-CM | POA: Diagnosis not present

## 2018-05-29 DIAGNOSIS — N920 Excessive and frequent menstruation with regular cycle: Secondary | ICD-10-CM | POA: Diagnosis not present

## 2018-05-29 DIAGNOSIS — N924 Excessive bleeding in the premenopausal period: Secondary | ICD-10-CM | POA: Diagnosis not present

## 2018-12-08 DIAGNOSIS — E538 Deficiency of other specified B group vitamins: Secondary | ICD-10-CM | POA: Diagnosis not present

## 2018-12-08 DIAGNOSIS — E7800 Pure hypercholesterolemia, unspecified: Secondary | ICD-10-CM | POA: Diagnosis not present

## 2018-12-08 DIAGNOSIS — D508 Other iron deficiency anemias: Secondary | ICD-10-CM | POA: Diagnosis not present

## 2018-12-08 DIAGNOSIS — I10 Essential (primary) hypertension: Secondary | ICD-10-CM | POA: Diagnosis not present

## 2019-02-03 DIAGNOSIS — E559 Vitamin D deficiency, unspecified: Secondary | ICD-10-CM | POA: Diagnosis not present

## 2019-02-03 DIAGNOSIS — E7800 Pure hypercholesterolemia, unspecified: Secondary | ICD-10-CM | POA: Diagnosis not present

## 2019-02-03 DIAGNOSIS — D508 Other iron deficiency anemias: Secondary | ICD-10-CM | POA: Diagnosis not present

## 2019-02-03 DIAGNOSIS — I10 Essential (primary) hypertension: Secondary | ICD-10-CM | POA: Diagnosis not present

## 2019-02-03 DIAGNOSIS — E538 Deficiency of other specified B group vitamins: Secondary | ICD-10-CM | POA: Diagnosis not present

## 2019-06-28 DIAGNOSIS — Z6839 Body mass index (BMI) 39.0-39.9, adult: Secondary | ICD-10-CM | POA: Diagnosis not present

## 2019-06-28 DIAGNOSIS — Z9189 Other specified personal risk factors, not elsewhere classified: Secondary | ICD-10-CM | POA: Diagnosis not present

## 2019-06-28 DIAGNOSIS — Z1231 Encounter for screening mammogram for malignant neoplasm of breast: Secondary | ICD-10-CM | POA: Diagnosis not present

## 2019-06-28 DIAGNOSIS — Z01419 Encounter for gynecological examination (general) (routine) without abnormal findings: Secondary | ICD-10-CM | POA: Diagnosis not present

## 2019-06-28 DIAGNOSIS — N926 Irregular menstruation, unspecified: Secondary | ICD-10-CM | POA: Diagnosis not present

## 2019-07-30 DIAGNOSIS — I10 Essential (primary) hypertension: Secondary | ICD-10-CM | POA: Diagnosis not present

## 2019-07-30 DIAGNOSIS — E7800 Pure hypercholesterolemia, unspecified: Secondary | ICD-10-CM | POA: Diagnosis not present

## 2019-07-30 DIAGNOSIS — D508 Other iron deficiency anemias: Secondary | ICD-10-CM | POA: Diagnosis not present

## 2019-07-30 DIAGNOSIS — Z0000 Encounter for general adult medical examination without abnormal findings: Secondary | ICD-10-CM | POA: Diagnosis not present

## 2019-07-30 DIAGNOSIS — E538 Deficiency of other specified B group vitamins: Secondary | ICD-10-CM | POA: Diagnosis not present

## 2019-07-30 DIAGNOSIS — E559 Vitamin D deficiency, unspecified: Secondary | ICD-10-CM | POA: Diagnosis not present

## 2019-08-19 DIAGNOSIS — N926 Irregular menstruation, unspecified: Secondary | ICD-10-CM | POA: Diagnosis not present

## 2019-08-19 DIAGNOSIS — Z131 Encounter for screening for diabetes mellitus: Secondary | ICD-10-CM | POA: Diagnosis not present

## 2020-01-31 DIAGNOSIS — E538 Deficiency of other specified B group vitamins: Secondary | ICD-10-CM | POA: Diagnosis not present

## 2020-01-31 DIAGNOSIS — D508 Other iron deficiency anemias: Secondary | ICD-10-CM | POA: Diagnosis not present

## 2020-01-31 DIAGNOSIS — L219 Seborrheic dermatitis, unspecified: Secondary | ICD-10-CM | POA: Diagnosis not present

## 2020-01-31 DIAGNOSIS — I10 Essential (primary) hypertension: Secondary | ICD-10-CM | POA: Diagnosis not present

## 2020-01-31 DIAGNOSIS — E559 Vitamin D deficiency, unspecified: Secondary | ICD-10-CM | POA: Diagnosis not present

## 2020-01-31 DIAGNOSIS — E7800 Pure hypercholesterolemia, unspecified: Secondary | ICD-10-CM | POA: Diagnosis not present

## 2020-01-31 DIAGNOSIS — R7303 Prediabetes: Secondary | ICD-10-CM | POA: Diagnosis not present

## 2020-07-14 ENCOUNTER — Other Ambulatory Visit: Payer: Self-pay | Admitting: Family Medicine

## 2020-07-14 ENCOUNTER — Ambulatory Visit
Admission: RE | Admit: 2020-07-14 | Discharge: 2020-07-14 | Disposition: A | Discharge status: Home / Self Care | Payer: BC Managed Care – PPO | Source: Ambulatory Visit | Attending: Family Medicine | Admitting: Family Medicine

## 2020-07-14 ENCOUNTER — Other Ambulatory Visit: Payer: Self-pay

## 2020-07-14 DIAGNOSIS — R0781 Pleurodynia: Secondary | ICD-10-CM | POA: Diagnosis not present

## 2020-07-14 DIAGNOSIS — R1011 Right upper quadrant pain: Secondary | ICD-10-CM

## 2020-07-14 DIAGNOSIS — K760 Fatty (change of) liver, not elsewhere classified: Secondary | ICD-10-CM | POA: Diagnosis not present

## 2020-07-14 DIAGNOSIS — R079 Chest pain, unspecified: Secondary | ICD-10-CM | POA: Diagnosis not present

## 2020-07-14 DIAGNOSIS — R109 Unspecified abdominal pain: Secondary | ICD-10-CM | POA: Diagnosis not present

## 2020-08-30 DIAGNOSIS — Z0000 Encounter for general adult medical examination without abnormal findings: Secondary | ICD-10-CM | POA: Diagnosis not present

## 2021-04-30 DIAGNOSIS — I10 Essential (primary) hypertension: Secondary | ICD-10-CM | POA: Diagnosis not present

## 2021-04-30 DIAGNOSIS — D508 Other iron deficiency anemias: Secondary | ICD-10-CM | POA: Diagnosis not present

## 2021-04-30 DIAGNOSIS — E538 Deficiency of other specified B group vitamins: Secondary | ICD-10-CM | POA: Diagnosis not present

## 2021-04-30 DIAGNOSIS — R8281 Pyuria: Secondary | ICD-10-CM | POA: Diagnosis not present

## 2021-04-30 DIAGNOSIS — E7800 Pure hypercholesterolemia, unspecified: Secondary | ICD-10-CM | POA: Diagnosis not present

## 2021-04-30 DIAGNOSIS — R7303 Prediabetes: Secondary | ICD-10-CM | POA: Diagnosis not present

## 2021-04-30 DIAGNOSIS — E559 Vitamin D deficiency, unspecified: Secondary | ICD-10-CM | POA: Diagnosis not present

## 2021-05-09 DIAGNOSIS — S61209A Unspecified open wound of unspecified finger without damage to nail, initial encounter: Secondary | ICD-10-CM | POA: Diagnosis not present

## 2021-10-18 DIAGNOSIS — I10 Essential (primary) hypertension: Secondary | ICD-10-CM | POA: Diagnosis not present

## 2021-10-18 DIAGNOSIS — Z0000 Encounter for general adult medical examination without abnormal findings: Secondary | ICD-10-CM | POA: Diagnosis not present

## 2021-10-18 DIAGNOSIS — R7303 Prediabetes: Secondary | ICD-10-CM | POA: Diagnosis not present

## 2021-10-18 DIAGNOSIS — E559 Vitamin D deficiency, unspecified: Secondary | ICD-10-CM | POA: Diagnosis not present

## 2021-10-18 DIAGNOSIS — E538 Deficiency of other specified B group vitamins: Secondary | ICD-10-CM | POA: Diagnosis not present

## 2023-03-30 IMAGING — DX DG CHEST 2V
2 series · 2 of 2 positions shown · non-contrast
Comparison: No priors.

CLINICAL DATA: 49-year-old female with history of right-sided
pleuritic chest pain.

EXAM:
CHEST - 2 VIEW

[dg chest 2 view (1 of 2)]
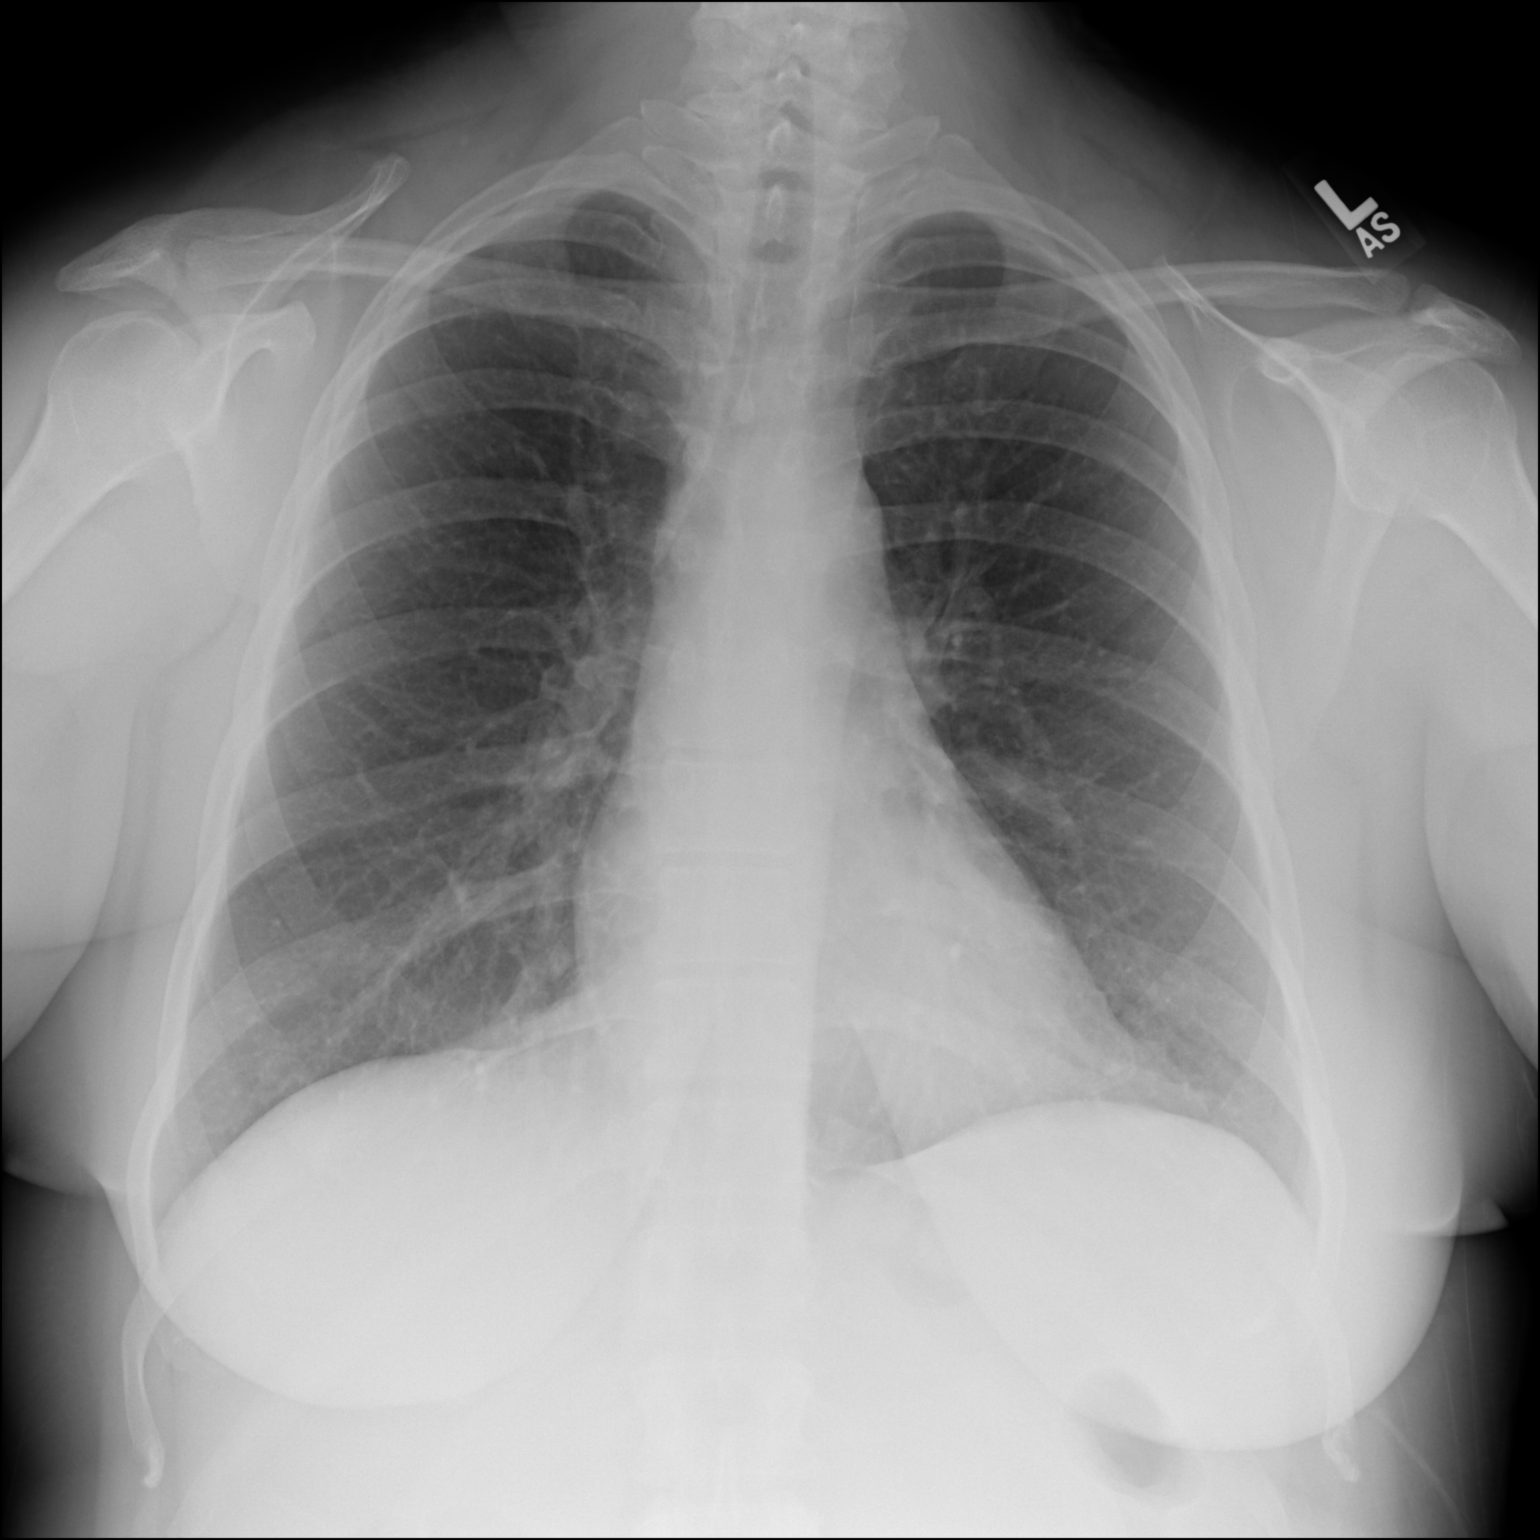

[dg chest 2 view (2 of 2)]
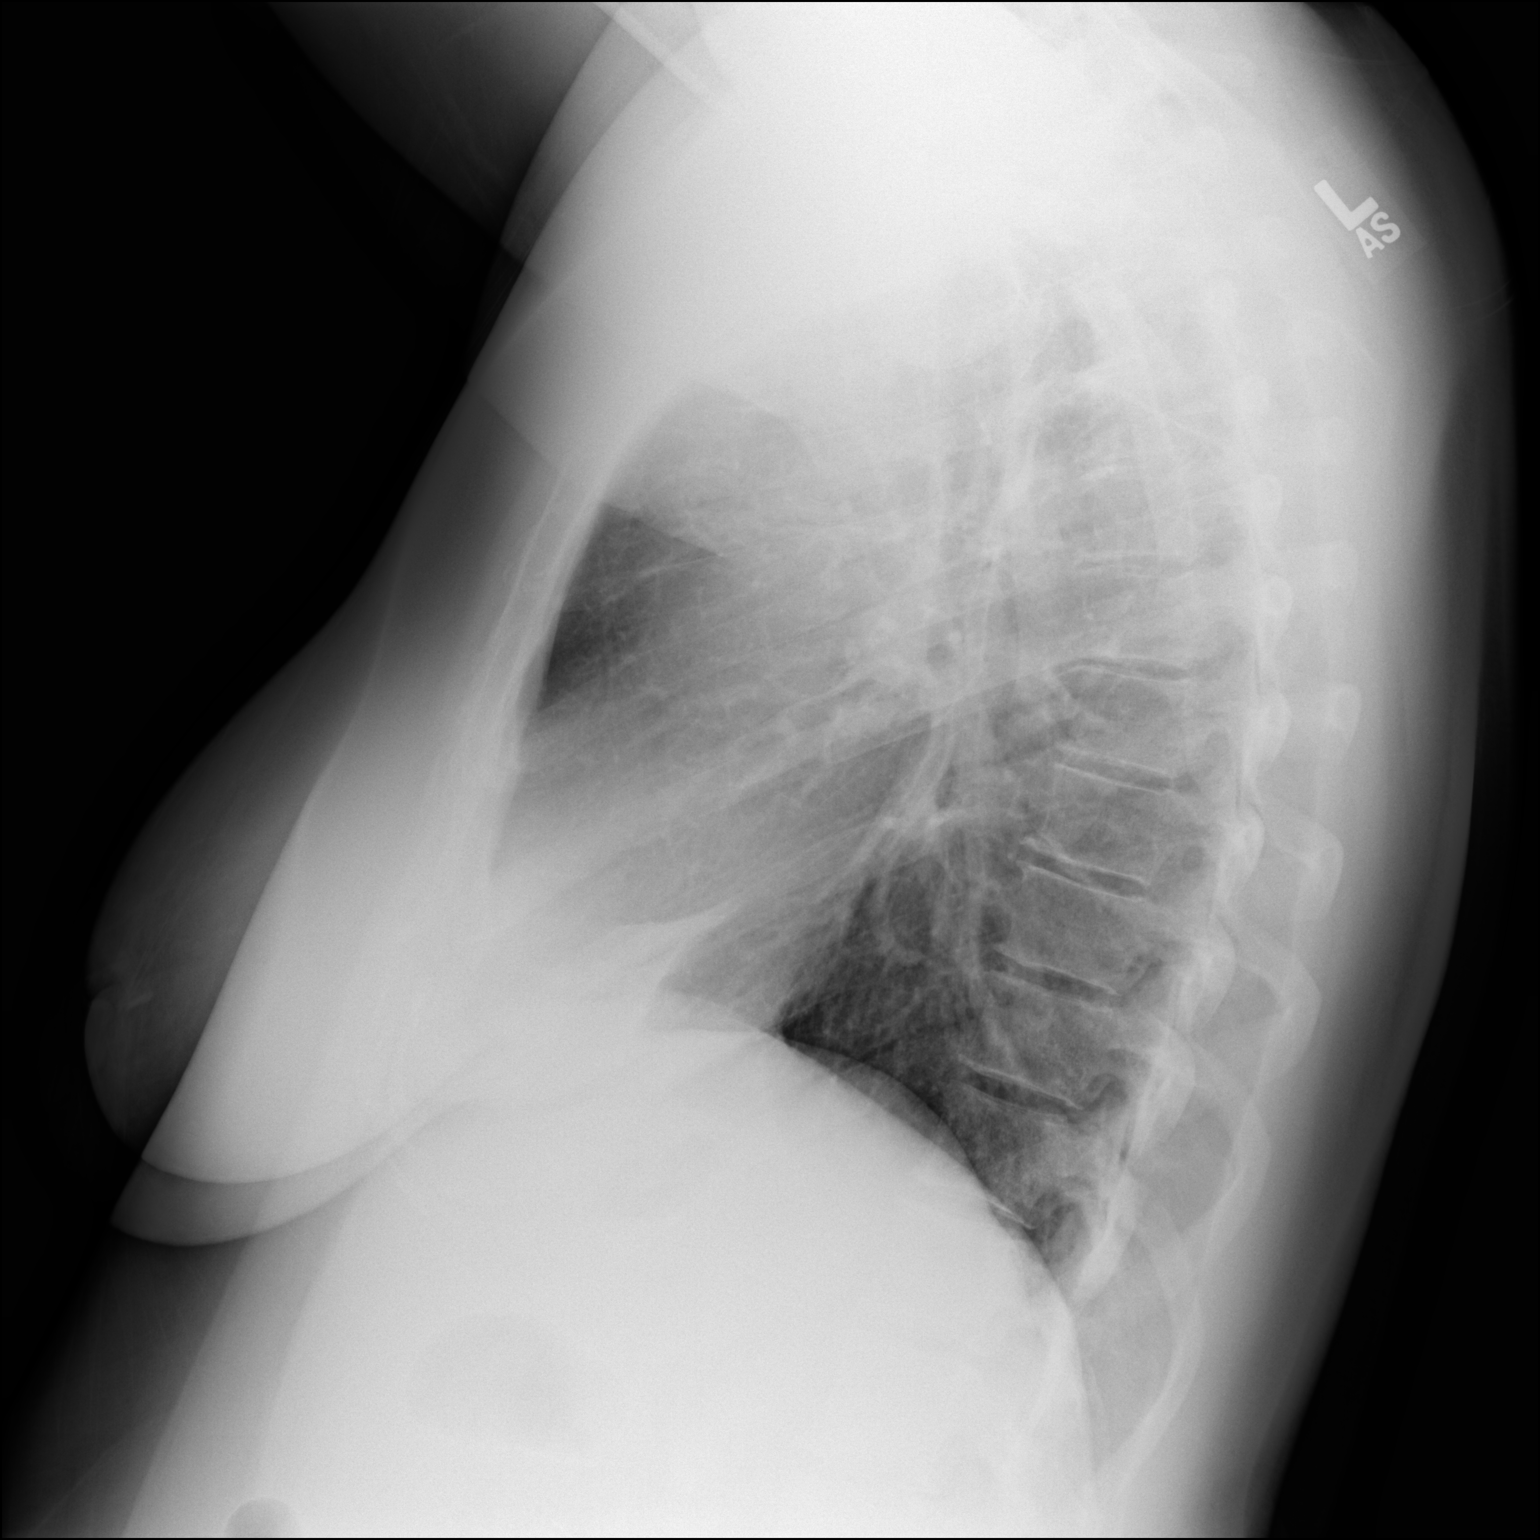

[2 of 2 positions shown; findings below may reference images not displayed]

FINDINGS: Lung volumes are normal. No consolidative airspace disease. No
pleural effusions. No pneumothorax. No pulmonary nodule or mass
noted. Pulmonary vasculature and the cardiomediastinal silhouette
are within normal limits.
IMPRESSION: No radiographic evidence of acute cardiopulmonary disease.

## 2023-06-17 DIAGNOSIS — M5431 Sciatica, right side: Secondary | ICD-10-CM | POA: Diagnosis not present

## 2023-06-17 DIAGNOSIS — M7989 Other specified soft tissue disorders: Secondary | ICD-10-CM | POA: Diagnosis not present

## 2023-06-17 DIAGNOSIS — M7061 Trochanteric bursitis, right hip: Secondary | ICD-10-CM | POA: Diagnosis not present

## 2023-06-19 ENCOUNTER — Encounter: Payer: Self-pay | Admitting: Sports Medicine

## 2023-06-19 ENCOUNTER — Other Ambulatory Visit: Payer: Self-pay | Admitting: Sports Medicine

## 2023-06-19 DIAGNOSIS — M7989 Other specified soft tissue disorders: Secondary | ICD-10-CM

## 2023-06-25 ENCOUNTER — Ambulatory Visit
Admission: RE | Admit: 2023-06-25 | Discharge: 2023-06-25 | Disposition: A | Discharge status: Home / Self Care | Source: Ambulatory Visit | Attending: Sports Medicine | Admitting: Sports Medicine

## 2023-06-25 DIAGNOSIS — M7989 Other specified soft tissue disorders: Secondary | ICD-10-CM

## 2023-06-25 DIAGNOSIS — R2241 Localized swelling, mass and lump, right lower limb: Secondary | ICD-10-CM | POA: Diagnosis not present

## 2023-07-22 DIAGNOSIS — R1031 Right lower quadrant pain: Secondary | ICD-10-CM | POA: Diagnosis not present

## 2023-07-22 DIAGNOSIS — M898X8 Other specified disorders of bone, other site: Secondary | ICD-10-CM | POA: Diagnosis not present

## 2023-07-22 DIAGNOSIS — M25551 Pain in right hip: Secondary | ICD-10-CM | POA: Diagnosis not present

## 2023-07-22 DIAGNOSIS — I10 Essential (primary) hypertension: Secondary | ICD-10-CM | POA: Diagnosis not present

## 2023-08-12 DIAGNOSIS — M7061 Trochanteric bursitis, right hip: Secondary | ICD-10-CM | POA: Diagnosis not present

## 2023-08-12 DIAGNOSIS — M7062 Trochanteric bursitis, left hip: Secondary | ICD-10-CM | POA: Diagnosis not present

## 2023-08-12 DIAGNOSIS — N83202 Unspecified ovarian cyst, left side: Secondary | ICD-10-CM | POA: Diagnosis not present

## 2023-08-12 DIAGNOSIS — N281 Cyst of kidney, acquired: Secondary | ICD-10-CM | POA: Diagnosis not present

## 2023-08-26 DIAGNOSIS — M25551 Pain in right hip: Secondary | ICD-10-CM | POA: Diagnosis not present

## 2023-08-26 DIAGNOSIS — M5451 Vertebrogenic low back pain: Secondary | ICD-10-CM | POA: Diagnosis not present

## 2023-08-31 DIAGNOSIS — M25511 Pain in right shoulder: Secondary | ICD-10-CM | POA: Diagnosis not present

## 2023-08-31 DIAGNOSIS — R109 Unspecified abdominal pain: Secondary | ICD-10-CM | POA: Diagnosis not present

## 2023-09-01 DIAGNOSIS — M25551 Pain in right hip: Secondary | ICD-10-CM | POA: Diagnosis not present

## 2023-09-01 DIAGNOSIS — M5451 Vertebrogenic low back pain: Secondary | ICD-10-CM | POA: Diagnosis not present

## 2023-09-03 DIAGNOSIS — M5451 Vertebrogenic low back pain: Secondary | ICD-10-CM | POA: Diagnosis not present

## 2023-09-03 DIAGNOSIS — M25551 Pain in right hip: Secondary | ICD-10-CM | POA: Diagnosis not present

## 2023-09-04 DIAGNOSIS — M5459 Other low back pain: Secondary | ICD-10-CM | POA: Diagnosis not present

## 2023-09-04 DIAGNOSIS — M792 Neuralgia and neuritis, unspecified: Secondary | ICD-10-CM | POA: Diagnosis not present

## 2023-09-04 DIAGNOSIS — M25551 Pain in right hip: Secondary | ICD-10-CM | POA: Diagnosis not present

## 2023-09-08 DIAGNOSIS — M5451 Vertebrogenic low back pain: Secondary | ICD-10-CM | POA: Diagnosis not present

## 2023-09-08 DIAGNOSIS — M25551 Pain in right hip: Secondary | ICD-10-CM | POA: Diagnosis not present

## 2023-09-12 DIAGNOSIS — M5451 Vertebrogenic low back pain: Secondary | ICD-10-CM | POA: Diagnosis not present

## 2023-09-12 DIAGNOSIS — M25551 Pain in right hip: Secondary | ICD-10-CM | POA: Diagnosis not present

## 2023-09-24 DIAGNOSIS — M5459 Other low back pain: Secondary | ICD-10-CM | POA: Diagnosis not present

## 2023-09-24 DIAGNOSIS — N950 Postmenopausal bleeding: Secondary | ICD-10-CM | POA: Diagnosis not present

## 2023-10-09 DIAGNOSIS — M7061 Trochanteric bursitis, right hip: Secondary | ICD-10-CM | POA: Diagnosis not present

## 2023-11-13 DIAGNOSIS — M25551 Pain in right hip: Secondary | ICD-10-CM | POA: Diagnosis not present

## 2023-11-13 DIAGNOSIS — R7303 Prediabetes: Secondary | ICD-10-CM | POA: Diagnosis not present

## 2023-11-13 DIAGNOSIS — Z0000 Encounter for general adult medical examination without abnormal findings: Secondary | ICD-10-CM | POA: Diagnosis not present

## 2023-11-13 DIAGNOSIS — E538 Deficiency of other specified B group vitamins: Secondary | ICD-10-CM | POA: Diagnosis not present

## 2023-11-13 DIAGNOSIS — I10 Essential (primary) hypertension: Secondary | ICD-10-CM | POA: Diagnosis not present

## 2023-11-13 DIAGNOSIS — E7800 Pure hypercholesterolemia, unspecified: Secondary | ICD-10-CM | POA: Diagnosis not present

## 2023-11-13 DIAGNOSIS — D508 Other iron deficiency anemias: Secondary | ICD-10-CM | POA: Diagnosis not present

## 2023-11-13 DIAGNOSIS — E559 Vitamin D deficiency, unspecified: Secondary | ICD-10-CM | POA: Diagnosis not present

## 2023-12-30 DIAGNOSIS — Z1231 Encounter for screening mammogram for malignant neoplasm of breast: Secondary | ICD-10-CM | POA: Diagnosis not present

## 2023-12-30 DIAGNOSIS — Z01419 Encounter for gynecological examination (general) (routine) without abnormal findings: Secondary | ICD-10-CM | POA: Diagnosis not present

## 2023-12-30 DIAGNOSIS — Z6838 Body mass index (BMI) 38.0-38.9, adult: Secondary | ICD-10-CM | POA: Diagnosis not present
